# Patient Record
Sex: Female | Born: 1995 | Race: Black or African American | Hispanic: No | Marital: Single | Smoking: Never smoker
Health system: Southern US, Community
[De-identification: ages and names within clinical notes are randomized; demographics above are authoritative.]

## PROBLEM LIST (undated history)

## (undated) ENCOUNTER — Emergency Department (HOSPITAL_COMMUNITY): Admission: EM | Payer: Self-pay | Source: Home / Self Care

## (undated) HISTORY — PX: NO PAST SURGERIES: SHX2092

---
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
## 2016-07-30 ENCOUNTER — Emergency Department
Admission: EM | Admit: 2016-07-30 | Discharge: 2016-07-30 | Disposition: A | Discharge status: Home / Self Care | Payer: Medicaid Other | Attending: Emergency Medicine | Admitting: Emergency Medicine

## 2016-07-30 DIAGNOSIS — J020 Streptococcal pharyngitis: Secondary | ICD-10-CM | POA: Insufficient documentation

## 2016-07-30 DIAGNOSIS — J029 Acute pharyngitis, unspecified: Secondary | ICD-10-CM | POA: Diagnosis present

## 2016-07-30 LAB — POCT RAPID STREP A: STREPTOCOCCUS, GROUP A SCREEN (DIRECT): POSITIVE — AB

## 2016-07-30 MED ORDER — AMOXICILLIN 500 MG PO TABS: 500.0000 mg | ORAL_TABLET | Freq: Two times a day (BID) | ORAL | 0 refills | Status: DC

## 2016-07-30 MED ORDER — LIDOCAINE VISCOUS 2 % MT SOLN: 10.0000 mL | OROMUCOSAL | 0 refills | Status: DC | PRN

## 2016-07-30 NOTE — ED Triage Notes (Signed)
Pt reports to ED w/ c/o sore throat that start yesterday. Pt a/ox4, resp even and unlabored, pt ambulatory to triage w/o issue. +PO intake

## 2016-07-30 NOTE — ED Provider Notes (Signed)
[facility] Emergency Department Provider Note  ____________________________________________  Time seen: Approximately 4:05 PM  I have reviewed the triage vital signs and the nursing notes.   HISTORY  Chief Complaint Sore Throat    HPI Monica Strickland is a 21 y.o. female that presents to the emergency department with 2 days of sore throat. She states that she feels warm but has not checked her temperature. Patient is eating and drinking normally.She has had strep throat once previously. No alleviating measures been tried. She denies sick contacts. She denies congestion, cough, shortness of breath, chest pain, nausea, vomiting, abdominal pain.   History reviewed. No pertinent past medical history.  There are no active problems to display for this patient.   Past Surgical History:  Procedure Laterality Date  . NO PAST SURGERIES      Prior to Admission medications   Medication Sig Start Date End Date Taking? Authorizing Provider  amoxicillin (AMOXIL) 500 MG tablet Take 1 tablet (500 mg total) by mouth 2 (two) times daily. 07/30/16   Enid Derry, PA-C  lidocaine (XYLOCAINE) 2 % solution Use as directed 10 mLs in the mouth or throat as needed for mouth pain. 07/30/16   Enid Derry, PA-C    Allergies Patient has no known allergies.  No family history on file.  Social History Social History  Substance Use Topics  . Smoking status: Never Smoker  . Smokeless tobacco: Never Used  . Alcohol use No     Review of Systems  Constitutional: No chills Eyes: No visual changes. No discharge. ENT: Negative for congestion and rhinorrhea. Cardiovascular: No chest pain. Respiratory: Negative for cough. No SOB. Gastrointestinal: No abdominal pain.  No nausea, no vomiting.  No diarrhea.  No constipation. Musculoskeletal: Negative for musculoskeletal pain. Skin: Negative for rash, abrasions, lacerations, ecchymosis. Neurological: Negative for  headaches.   ____________________________________________   PHYSICAL EXAM:  VITAL SIGNS: ED Triage Vitals [07/30/16 1524]  Enc Vitals Group     BP 140/83     Pulse Rate 99     Resp 18     Temp 99.1 F (37.3 C)     Temp Source Oral     SpO2 99 %     Weight 180 lb (81.6 kg)     Height  (1.778 m)     Head Circumference      Peak Flow      Pain Score 10     Pain Loc      Pain Edu?      Excl. in GC?      Constitutional: Alert and oriented. Well appearing and in no acute distress. Eyes: Conjunctivae are normal. PERRL. EOMI. No discharge. Head: Atraumatic. ENT: No frontal and maxillary sinus tenderness.      Ears: Tympanic membranes pearly gray with good landmarks. No discharge.      Nose: Mild congestion/rhinnorhea.      Mouth/Throat: Mucous membranes are moist. Oropharynx erythematous. Tonsils enlarged bilaterally. Exudates present. Uvula midline. Neck: No stridor.   Hematological/Lymphatic/Immunilogical: Tender anterior cervical lymphadenopathy. Cardiovascular: Normal rate, regular rhythm.  Good peripheral circulation. Respiratory: Normal respiratory effort without tachypnea or retractions. Lungs CTAB. Good air entry to the bases with no decreased or absent breath sounds. Gastrointestinal: Bowel sounds 4 quadrants. Soft and nontender to palpation. No guarding or rigidity. No palpable masses. No distention. Musculoskeletal: Full range of motion to all extremities. No gross deformities appreciated. Neurologic:  Normal speech and language. No gross focal neurologic deficits are appreciated.  Skin:  Skin is warm, dry and intact. No rash noted.   ____________________________________________   LABS (all labs ordered are listed, but only abnormal results are displayed)  Labs Reviewed  POCT RAPID STREP A - Abnormal; Notable for the following:       Result Value   Streptococcus, Group A Screen (Direct) POSITIVE (*)    All other components within normal limits    ____________________________________________  EKG   ____________________________________________  RADIOLOGY   No results found.  ____________________________________________    PROCEDURES  Procedure(s) performed:    Procedures    Medications - No data to display   ____________________________________________   INITIAL IMPRESSION / ASSESSMENT AND PLAN / ED COURSE  Pertinent labs & imaging results that were available during my care of the patient were reviewed by me and considered in my medical decision making (see chart for details).  Review of the [place] CSRS was performed in accordance of the [facility] prior to dispensing any controlled drugs.     Patient's diagnosis is consistent with strep pharyngitis. Vital signs and exam are reassuring. Patient appears well and is staying well hydrated. Patient should alternate tylenol and ibuprofen for fever. Patient feels comfortable going home. Patient will be discharged home with prescriptions for amoxicillin and viscous lidocain. Patient is to follow up with PCP as needed or otherwise directed. Patient is given ED precautions to return to the ED for any worsening or new symptoms.     ____________________________________________  FINAL CLINICAL IMPRESSION(S) / ED DIAGNOSES  Final diagnoses:  Strep throat      NEW MEDICATIONS STARTED DURING THIS VISIT:  New Prescriptions   AMOXICILLIN (AMOXIL) 500 MG TABLET    Take 1 tablet (500 mg total) by mouth 2 (two) times daily.   LIDOCAINE (XYLOCAINE) 2 % SOLUTION    Use as directed 10 mLs in the mouth or throat as needed for mouth pain.        This chart was dictated using voice recognition software/Dragon. Despite best efforts to proofread, errors can occur which can change the meaning. Any change was purely unintentional.    Enid Derry, PA-C 07/30/16 1629    Myrna Blazer, MD 07/30/16 [phone]

## 2016-07-30 NOTE — ED Notes (Signed)
See triage note  States she developed sore throat about 3-4 days   No diff swallowing  Low grade fever on arrival

## 2017-09-11 ENCOUNTER — Emergency Department
Admission: EM | Admit: 2017-09-11 | Discharge: 2017-09-11 | Disposition: A | Discharge status: Home / Self Care | Payer: No Typology Code available for payment source | Attending: Emergency Medicine | Admitting: Emergency Medicine

## 2017-09-11 ENCOUNTER — Emergency Department: Payer: No Typology Code available for payment source

## 2017-09-11 ENCOUNTER — Other Ambulatory Visit: Payer: Self-pay

## 2017-09-11 DIAGNOSIS — S8011XA Contusion of right lower leg, initial encounter: Secondary | ICD-10-CM | POA: Diagnosis not present

## 2017-09-11 DIAGNOSIS — S80922A Unspecified superficial injury of left lower leg, initial encounter: Secondary | ICD-10-CM | POA: Insufficient documentation

## 2017-09-11 DIAGNOSIS — Y9389 Activity, other specified: Secondary | ICD-10-CM | POA: Diagnosis not present

## 2017-09-11 DIAGNOSIS — Y9289 Other specified places as the place of occurrence of the external cause: Secondary | ICD-10-CM | POA: Diagnosis not present

## 2017-09-11 DIAGNOSIS — Y999 Unspecified external cause status: Secondary | ICD-10-CM | POA: Insufficient documentation

## 2017-09-11 DIAGNOSIS — T07XXXA Unspecified multiple injuries, initial encounter: Secondary | ICD-10-CM

## 2017-09-11 DIAGNOSIS — S8991XA Unspecified injury of right lower leg, initial encounter: Secondary | ICD-10-CM | POA: Diagnosis present

## 2017-09-11 DIAGNOSIS — R2241 Localized swelling, mass and lump, right lower limb: Secondary | ICD-10-CM | POA: Diagnosis not present

## 2017-09-11 DIAGNOSIS — S80811A Abrasion, right lower leg, initial encounter: Secondary | ICD-10-CM | POA: Diagnosis not present

## 2017-09-11 MED ORDER — BACITRACIN-NEOMYCIN-POLYMYXIN 400-5-5000 EX OINT
TOPICAL_OINTMENT | Freq: Once | CUTANEOUS | Status: AC
  Administered 2017-09-11: 2 via TOPICAL
  Filled 2017-09-11: qty 1

## 2017-09-11 MED ORDER — HYDROMORPHONE HCL 1 MG/ML IJ SOLN
1.0000 mg | Freq: Once | INTRAMUSCULAR | Status: AC
  Administered 2017-09-11: 1 mg via INTRAMUSCULAR
  Filled 2017-09-11: qty 1

## 2017-09-11 MED ORDER — KETOROLAC TROMETHAMINE 60 MG/2ML IM SOLN
60.0000 mg | Freq: Once | INTRAMUSCULAR | Status: AC
  Administered 2017-09-11: 60 mg via INTRAMUSCULAR
  Filled 2017-09-11: qty 2

## 2017-09-11 MED ORDER — NEOMYCIN-POLYMYXIN-PRAMOXINE 1 % EX CREA: TOPICAL_CREAM | Freq: Two times a day (BID) | CUTANEOUS | 0 refills | Status: DC

## 2017-09-11 MED ORDER — TRAMADOL HCL 50 MG PO TABS: 50.0000 mg | ORAL_TABLET | Freq: Two times a day (BID) | ORAL | 0 refills | Status: DC | PRN

## 2017-09-11 MED ORDER — BACITRACIN-NEOMYCIN-POLYMYXIN 400-5-5000 EX OINT
TOPICAL_OINTMENT | CUTANEOUS | Status: AC
  Filled 2017-09-11: qty 1

## 2017-09-11 MED ORDER — IBUPROFEN 800 MG PO TABS: 800.0000 mg | ORAL_TABLET | Freq: Three times a day (TID) | ORAL | 0 refills | Status: DC | PRN

## 2017-09-11 NOTE — Discharge Instructions (Signed)
Daily dressing changes to abrasion and ambulate with crutches for 2 to 3 days as needed.
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
## 2018-11-14 ENCOUNTER — Emergency Department
Admission: EM | Admit: 2018-11-14 | Discharge: 2018-11-15 | Disposition: A | Discharge status: Home / Self Care | Payer: BC Managed Care – PPO | Attending: Emergency Medicine | Admitting: Emergency Medicine

## 2018-11-14 ENCOUNTER — Encounter: Payer: Self-pay | Admitting: Emergency Medicine

## 2018-11-14 DIAGNOSIS — Z79899 Other long term (current) drug therapy: Secondary | ICD-10-CM | POA: Diagnosis not present

## 2018-11-14 DIAGNOSIS — R112 Nausea with vomiting, unspecified: Secondary | ICD-10-CM | POA: Diagnosis not present

## 2018-11-14 DIAGNOSIS — R1010 Upper abdominal pain, unspecified: Secondary | ICD-10-CM | POA: Insufficient documentation

## 2018-11-14 LAB — POCT PREGNANCY, URINE: Preg Test, Ur: NEGATIVE

## 2018-11-14 LAB — URINALYSIS, COMPLETE (UACMP) WITH MICROSCOPIC
Bacteria, UA: NONE SEEN
Bilirubin Urine: NEGATIVE
Glucose, UA: NEGATIVE mg/dL
Ketones, ur: NEGATIVE mg/dL
Leukocytes,Ua: NEGATIVE
Nitrite: NEGATIVE
Protein, ur: NEGATIVE mg/dL
Specific Gravity, Urine: 1.013 (ref 1.005–1.030)
pH: 8 (ref 5.0–8.0)

## 2018-11-14 LAB — CBC
HCT: 36.2 % (ref 36.0–46.0)
Hemoglobin: 12.5 g/dL (ref 12.0–15.0)
MCH: 25.9 pg — ABNORMAL LOW (ref 26.0–34.0)
MCHC: 34.5 g/dL (ref 30.0–36.0)
MCV: 74.9 fL — ABNORMAL LOW (ref 80.0–100.0)
Platelets: 236 10*3/uL (ref 150–400)
RBC: 4.83 MIL/uL (ref 3.87–5.11)
RDW: 14.4 % (ref 11.5–15.5)
WBC: 7.4 10*3/uL (ref 4.0–10.5)
nRBC: 0 % (ref 0.0–0.2)

## 2018-11-14 LAB — COMPREHENSIVE METABOLIC PANEL
ALT: 14 U/L (ref 0–44)
AST: 19 U/L (ref 15–41)
Albumin: 4.2 g/dL (ref 3.5–5.0)
Alkaline Phosphatase: 41 U/L (ref 38–126)
Anion gap: 8 (ref 5–15)
BUN: 9 mg/dL (ref 6–20)
CO2: 24 mmol/L (ref 22–32)
Calcium: 9.4 mg/dL (ref 8.9–10.3)
Chloride: 105 mmol/L (ref 98–111)
Creatinine, Ser: 0.6 mg/dL (ref 0.44–1.00)
GFR calc Af Amer: >60 mL/min (ref >60)
GFR calc non Af Amer: >60 mL/min (ref >60)
Glucose, Bld: 100 mg/dL — ABNORMAL HIGH (ref 70–99)
Potassium: 3.9 mmol/L (ref 3.5–5.1)
Sodium: 137 mmol/L (ref 135–145)
Total Bilirubin: 0.6 mg/dL (ref 0.3–1.2)
Total Protein: 7.3 g/dL (ref 6.5–8.1)

## 2018-11-14 LAB — LIPASE, BLOOD: Lipase: 23 U/L (ref 11–51)

## 2018-11-14 NOTE — ED Triage Notes (Signed)
Pt c/o generalized abdominal pain, N/V/D x4 days. Pt denies urinary symptoms as well as fever.

## 2018-11-15 ENCOUNTER — Emergency Department: Payer: BC Managed Care – PPO

## 2018-11-15 MED ORDER — SUCRALFATE 1 G PO TABS: 1.0000 g | ORAL_TABLET | Freq: Four times a day (QID) | ORAL | 1 refills | Status: DC | PRN

## 2018-11-15 MED ORDER — HYDROCODONE-ACETAMINOPHEN 5-325 MG PO TABS: 2.0000 | ORAL_TABLET | Freq: Four times a day (QID) | ORAL | 0 refills | Status: DC | PRN

## 2018-11-15 MED ORDER — ONDANSETRON 4 MG PO TBDP: ORAL_TABLET | ORAL | 0 refills | Status: DC

## 2018-11-15 MED ORDER — OMEPRAZOLE MAGNESIUM 20 MG PO TBEC: 20.0000 mg | DELAYED_RELEASE_TABLET | Freq: Every day | ORAL | 1 refills | Status: DC

## 2018-11-15 NOTE — ED Notes (Signed)
Patient states having stomach pain since Saturday worse after eating and when going to bathroom. Mid upper belly pain tender with palpation. Patient states yesterday bowel movements was hard and pellet like and today watery diarrhea.

## 2018-11-15 NOTE — ED Provider Notes (Signed)
[facility] Emergency Department Provider Note  ____________________________________________   First MD Initiated Contact with Patient 11/15/18 0007     (approximate)  I have reviewed the triage vital signs and the nursing notes.   HISTORY  Chief Complaint Abdominal Pain    HPI Monica Strickland is a 23 y.o. female he denies any chronic medical issues and denies any prior surgeries.  She presents for evaluation of multiple episodes of pain in her upper abdomen for the last few days.  She said it is generally worse after she eats and sometimes when she goes to the bathroom.  She said that it would come and go, but today it has been constant.  Nothing in particular makes it better or worse.  It started after lunch today and has not gone away.  She has had some nausea and the other day she had a little bit of vomiting but the nausea and vomiting are not consistent.  She has not had these symptoms in the past.  She denies fever, sore throat, chest pain, shortness of breath, dysuria, pelvic pain, and vaginal bleeding.  She believes it is about time for her menstrual cycle to begin.  She denies having any concerns for STD and has no new sexual partners.  She reports that the pain in her abdomen can be severe at times although the intensity waxes and wanes.  She has not had anything to eat or drink for about 10 hours.         History reviewed. No pertinent past medical history.  There are no active problems to display for this patient.   Past Surgical History:  Procedure Laterality Date   NO PAST SURGERIES      Prior to Admission medications   Medication Sig Start Date End Date Taking? Authorizing Provider  amoxicillin (AMOXIL) 500 MG tablet Take 1 tablet (500 mg total) by mouth 2 (two) times daily. 07/30/16   Enid DerryWagner, Ashley, PA-C  HYDROcodone-acetaminophen (NORCO/VICODIN) 5-325 MG tablet Take 2 tablets by mouth every 6 (six) hours as needed for moderate pain or  severe pain. 11/15/18   Loleta RoseForbach, Roshan Salamon, MD  ibuprofen (ADVIL,MOTRIN) 800 MG tablet Take 1 tablet (800 mg total) by mouth every 8 (eight) hours as needed for moderate pain. 09/11/17   Joni ReiningSmith, Ronald K, PA-C  lidocaine (XYLOCAINE) 2 % solution Use as directed 10 mLs in the mouth or throat as needed for mouth pain. 07/30/16   Enid DerryWagner, Ashley, PA-C  neomycin-polymyxin-pramoxine (NEOSPORIN PLUS) 1 % cream Apply topically 2 (two) times daily. 09/11/17   Joni ReiningSmith, Ronald K, PA-C  omeprazole (PRILOSEC OTC) 20 MG tablet Take 1 tablet (20 mg total) by mouth daily. 11/15/18 11/15/19  Loleta RoseForbach, Brittiney Dicostanzo, MD  ondansetron (ZOFRAN ODT) 4 MG disintegrating tablet Allow 1-2 tablets to dissolve in your mouth every 8 hours as needed for nausea/vomiting 11/15/18   Loleta RoseForbach, Shlonda Dolloff, MD  sucralfate (CARAFATE) 1 g tablet Take 1 tablet (1 g total) by mouth 4 (four) times daily as needed (for abdominal discomfort, nausea, and/or vomiting). 11/15/18   Loleta RoseForbach, Janice Seales, MD  traMADol (ULTRAM) 50 MG tablet Take 1 tablet (50 mg total) by mouth every 12 (twelve) hours as needed. 09/11/17   Joni ReiningSmith, Ronald K, PA-C    Allergies Patient has no known allergies.  History reviewed. No pertinent family history.  Social History Social History   Tobacco Use   Smoking status: Never Smoker   Smokeless tobacco: Never Used  Substance Use Topics   Alcohol use: No  Alcohol/week: 0.0 standard drinks   Drug use: No    Review of Systems Constitutional: No fever/chills Eyes: No visual changes. ENT: No sore throat. Cardiovascular: Denies chest pain. Respiratory: Denies shortness of breath. Gastrointestinal: Upper abdominal pain with intermittent nausea vomiting as described above Genitourinary: Negative for dysuria. Musculoskeletal: Negative for neck pain.  Negative for back pain. Integumentary: Negative for rash. Neurological: Negative for headaches, focal weakness or numbness.   ____________________________________________   PHYSICAL  EXAM:  VITAL SIGNS: ED Triage Vitals  Enc Vitals Group     BP 11/14/18 2054 (!) 161/77     Pulse Rate 11/14/18 2054 76     Resp 11/14/18 2054 16     Temp 11/14/18 2054 98.4 F (36.9 C)     Temp Source 11/14/18 2054 Oral     SpO2 11/14/18 2054 98 %     Weight 11/14/18 2055 95.3 kg (210 lb)     Height 11/14/18 2055 1.778 m (5\' 10" )     Head Circumference --      Peak Flow --      Pain Score --      Pain Loc --      Pain Edu? --      Excl. in GC? --     Constitutional: Alert and oriented. Well appearing and in no acute distress. Eyes: Conjunctivae are normal.  Head: Atraumatic. Nose: No congestion/rhinnorhea. Mouth/Throat: Mucous membranes are moist. Neck: No stridor.  No meningeal signs.   Cardiovascular: Normal rate, regular rhythm. Good peripheral circulation. Grossly normal heart sounds. Respiratory: Normal respiratory effort.  No retractions. No audible wheezing. Gastrointestinal: Soft and nondistended.  No lower abdominal tenderness to palpation with no rebound or guarding and no tenderness at McBurney's point.  Moderate tenderness to palpation of the epigastrium and severe tenderness of the right upper quadrant with positive Murphy sign. Musculoskeletal: No lower extremity tenderness nor edema. No gross deformities of extremities. Neurologic:  Normal speech and language. No gross focal neurologic deficits are appreciated.  Skin:  Skin is warm, dry and intact. No rash noted. Psychiatric: Mood and affect are normal. Speech and behavior are normal.  ____________________________________________   LABS (all labs ordered are listed, but only abnormal results are displayed)  Labs Reviewed  COMPREHENSIVE METABOLIC PANEL - Abnormal; Notable for the following components:      Result Value   Glucose, Bld 100 (*)    All other components within normal limits  CBC - Abnormal; Notable for the following components:   MCV 74.9 (*)    MCH 25.9 (*)    All other components within  normal limits  URINALYSIS, COMPLETE (UACMP) WITH MICROSCOPIC - Abnormal; Notable for the following components:   Color, Urine YELLOW (*)    APPearance CLEAR (*)    Hgb urine dipstick SMALL (*)    All other components within normal limits  LIPASE, BLOOD  POC URINE PREG, ED  POCT PREGNANCY, URINE   ____________________________________________  EKG  None - EKG not ordered by ED physician ____________________________________________  RADIOLOGY   ED MD interpretation:  Partially distended gallbladder with borderline wall thickening but no biliary dilatation and no obvious stones.  Official radiology report(s): [place] Abdomen Limited Ruq  Result Date: 11/15/2018 CLINICAL DATA:  Epigastric and right upper quadrant pain.  Nausea. EXAM: ULTRASOUND ABDOMEN LIMITED RIGHT UPPER QUADRANT COMPARISON:  None. FINDINGS: Gallbladder: Only partially distended. No gallstones. Wall thickness of 3.5 mm likely related to degree of distension. No pericholecystic fluid. No sonographic Murphy sign noted by  sonographer. Common bile duct: Diameter: 3 mm. Liver: No focal lesion identified. Within normal limits in parenchymal echogenicity. Portal vein is patent on color Doppler imaging with normal direction of blood flow towards the liver. IMPRESSION: No gallstones. Partially distended gallbladder with borderline wall thickening, likely related to incomplete distension. No biliary dilatation. Electronically Signed   By: Keith Rake M.D.   On: 11/15/2018 02:02    ____________________________________________   PROCEDURES   Procedure(s) performed (including Critical Care):  Procedures   ____________________________________________   INITIAL IMPRESSION / MDM / [place] / ED COURSE  As part of my medical decision making, I reviewed the following data within the [place] notes reviewed and incorporated, Labs reviewed , Old chart reviewed, Notes from prior ED visits and  [place] Controlled Substance Database   Differential diagnosis includes, but is not limited to, biliary colic including possible cholecystitis, acid reflux, peptic ulcer or gastric ulcer disease, less likely appendicitis or pneumonia or STD/PID.  The patient is well-appearing in no distress although she is severely tender to palpation in the upper abdomen and specifically the right upper quadrant.  Vital signs are normal and stable and afebrile.  No known contact with COVID-19 patients.  She has been n.p.o. long enough that we can get an ultrasound to evaluate the gallbladder.  Of note, her urinalysis is within normal limits, lipase normal, comprehensive metabolic panel is normal with no LFT elevation, and her CBC is normal.      Clinical Course as of Nov 15 235  Wed Nov 15, 2018  0231 Slightly distended gallbladder with a borderline thickened gallbladder wall but no stones visualized.  Common bile duct is normal as per radiology interpretation.   [CF]  2355 I discussed with the patient who is currently in no distress and ambulatory without any discomfort or apparent pain.  We talked about obtaining a CT scan but I explained to her that I do not think that this would be clinically useful at this time and she agrees that she does not want or need it.  We talked about the possibility that she had a gallstone that she passed which I think is the most likely diagnosis.  We will also treat for the possibility of acid reflux.  I told her about the clinical presentation of choledocholithiasis and encouraged her to come back if she develops any new or worsening symptoms and she understands and agrees.  I am providing follow-up information with gastroenterology and encourage her to call to schedule an appointment.  She also asked about women's health maintenance and I am providing the name and number of a OB/GYN with whom she can schedule an appointment.  She agrees with the plan.   [CF]    Clinical Course User  Index [CF] Hinda Kehr, MD     ____________________________________________  FINAL CLINICAL IMPRESSION(S) / ED DIAGNOSES  Final diagnoses:  Upper abdominal pain     MEDICATIONS GIVEN DURING THIS VISIT:  Medications - No data to display   ED Discharge Orders         Ordered    HYDROcodone-acetaminophen (NORCO/VICODIN) 5-325 MG tablet  Every 6 hours PRN     11/15/18 0234    ondansetron (ZOFRAN ODT) 4 MG disintegrating tablet     11/15/18 0234    sucralfate (CARAFATE) 1 g tablet  4 times daily PRN     11/15/18 0234    omeprazole (PRILOSEC OTC) 20 MG tablet  Daily  11/15/18 0234          *Please note:  Monica Strickland was evaluated in Emergency Department on 11/15/2018 for the symptoms described in the history of present illness. She was evaluated in the context of the global COVID-19 pandemic, which necessitated consideration that the patient might be at risk for infection with the SARS-CoV-2 virus that causes COVID-19. Institutional protocols and algorithms that pertain to the evaluation of patients at risk for COVID-19 are in a state of rapid change based on information released by regulatory bodies including the CDC and federal and state organizations. These policies and algorithms were followed during the patient's care in the ED.  Some ED evaluations and interventions may be delayed as a result of limited staffing during the pandemic.*  Note:  This document was prepared using Dragon voice recognition software and may include unintentional dictation errors.   Loleta RoseForbach, Elbia Paro, MD 11/15/18 [phone]

## 2018-11-15 NOTE — Discharge Instructions (Signed)
As we discussed, your work-up was reassuring today.  Your symptoms strongly suggest gallstones, but your ultrasound is generally reassuring.  It is possible that you had a small gallstone that already passed.  I recommend you read through the included information and use the prescribed medication as instructed.  Please follow-up with gastroenterology for a follow-up appointment at the next available opportunity.  As per our discussion, I also included information about follow-up with OB/GYN if you are interested in establishing a woman's health provider.    Return to the emergency department if you develop new or worsening symptoms that concern you.

## 2019-01-05 ENCOUNTER — Encounter (HOSPITAL_COMMUNITY): Payer: Self-pay

## 2019-01-05 ENCOUNTER — Ambulatory Visit (HOSPITAL_COMMUNITY)
Admission: EM | Admit: 2019-01-05 | Discharge: 2019-01-05 | Disposition: A | Discharge status: Home / Self Care | Payer: Self-pay | Attending: Family Medicine | Admitting: Family Medicine

## 2019-01-05 ENCOUNTER — Emergency Department (HOSPITAL_COMMUNITY)
Admission: EM | Admit: 2019-01-05 | Discharge: 2019-01-05 | Discharge status: Absent without leave | Payer: BC Managed Care – PPO

## 2019-01-05 DIAGNOSIS — L0291 Cutaneous abscess, unspecified: Secondary | ICD-10-CM

## 2019-01-05 MED ORDER — CEPHALEXIN 500 MG PO CAPS: 500.0000 mg | ORAL_CAPSULE | Freq: Four times a day (QID) | ORAL | 0 refills | Status: DC

## 2019-01-05 NOTE — ED Provider Notes (Addendum)
[facility]    CSN: [id number] Arrival date & time: 01/05/19  [id number]      History   Chief Complaint Chief Complaint  Patient presents with  . Abscess    HPI Monica Strickland is a 23 y.o. female.   Pt is a 23 year old female that presents today with abscess to left axilla.  Symptoms have been constant and worsening.  Symptoms have been present for approximately 1 week.  Denies any history of the same.  Reporting that she has had a lump to the axilla for a long time but never cause any issues.  Denies any fever, drainage.  She has been doing warm compresses on the area. No hx of MRSA  ROS per HPI      History reviewed. No pertinent past medical history.  There are no active problems to display for this patient.   Past Surgical History:  Procedure Laterality Date  . NO PAST SURGERIES      OB History   No obstetric history on file.      Home Medications    Prior to Admission medications   Medication Sig Start Date End Date Taking? Authorizing Provider  cephALEXin (KEFLEX) 500 MG capsule Take 1 capsule (500 mg total) by mouth 4 (four) times daily. 01/05/19   Dahlia ByesBast, Quanesha Klimaszewski A, NP  omeprazole (PRILOSEC OTC) 20 MG tablet Take 1 tablet (20 mg total) by mouth daily. 11/15/18 11/15/19  Loleta RoseForbach, Cory, MD  ondansetron (ZOFRAN ODT) 4 MG disintegrating tablet Allow 1-2 tablets to dissolve in your mouth every 8 hours as needed for nausea/vomiting 11/15/18   Loleta RoseForbach, Cory, MD  sucralfate (CARAFATE) 1 g tablet Take 1 tablet (1 g total) by mouth 4 (four) times daily as needed (for abdominal discomfort, nausea, and/or vomiting). 11/15/18   Loleta RoseForbach, Cory, MD    Family History Family History  Problem Relation Age of Onset  . Healthy Mother   . Healthy Father     Social History Social History   Tobacco Use  . Smoking status: Never Smoker  . Smokeless tobacco: Never Used  Substance Use Topics  . Alcohol use: No    Alcohol/week: 0.0 standard drinks  . Drug use: No      Allergies   Patient has no known allergies.   Review of Systems Review of Systems   Physical Exam Triage Vital Signs ED Triage Vitals  Enc Vitals Group     BP 01/05/19 0847 106/68     Pulse Rate 01/05/19 0847 90     Resp --      Temp 01/05/19 0847 97.6 F (36.4 C)     Temp Source 01/05/19 0847 Oral     SpO2 01/05/19 0847 97 %     Weight --      Height --      Head Circumference --      Peak Flow --      Pain Score 01/05/19 0845 10     Pain Loc --      Pain Edu? --      Excl. in GC? --    No data found.  Updated Vital Signs BP 106/68 (BP Location: Right Arm)   Pulse 90   Temp 97.6 F (36.4 C) (Oral)   SpO2 97%   Visual Acuity Right Eye Distance:   Left Eye Distance:   Bilateral Distance:    Right Eye Near:   Left Eye Near:    Bilateral Near:     Physical Exam  Vitals signs and nursing note reviewed.  Constitutional:      General: She is not in acute distress.    Appearance: Normal appearance. She is not ill-appearing, toxic-appearing or diaphoretic.  HENT:     Head: Normocephalic.     Nose: Nose normal.     Mouth/Throat:     Pharynx: Oropharynx is clear.  Eyes:     Conjunctiva/sclera: Conjunctivae normal.  Neck:     Musculoskeletal: Normal range of motion.  Pulmonary:     Effort: Pulmonary effort is normal.  Musculoskeletal: Normal range of motion.  Skin:    General: Skin is warm and dry.     Findings: No rash.     Comments: Approximated 3 to 4 cm abscess to left axilla, 2 cm fluctuance and surrounding erythema and mild induration. TTP.   Neurological:     Mental Status: She is alert.  Psychiatric:        Mood and Affect: Mood normal.      UC Treatments / Results  Labs (all labs ordered are listed, but only abnormal results are displayed) Labs Reviewed - No data to display  EKG   Radiology No results found.  Procedures Incision and Drainage  Date/Time: 01/05/2019 9:33 AM Performed by: Janace Aris, NP Authorized by: Janace Aris, NP   Consent:    Consent obtained:  Verbal   Consent given by:  Patient   Risks discussed:  Bleeding, incomplete drainage and pain   Alternatives discussed:  No treatment Universal protocol:    Patient identity confirmed:  Verbally with patient Location:    Type:  Abscess   Size:  4   Location:  Upper extremity   Upper extremity location:  Arm   Arm location:  L upper arm Pre-procedure details:    Skin preparation:  Betadine Anesthesia (see MAR for exact dosages):    Anesthesia method:  Local infiltration   Local anesthetic:  Lidocaine 1% w/o epi Procedure type:    Complexity:  Simple Procedure details:    Needle aspiration: no     Incision types:  Single straight   Incision depth:  Subcutaneous   Scalpel blade:  11   Wound management:  Probed and deloculated   Drainage:  Purulent and bloody   Drainage amount:  Moderate   Wound treatment:  Wound left open   Packing materials:  None Post-procedure details:    Patient tolerance of procedure:  Tolerated well, no immediate complications   (including critical care time)  Medications Ordered in UC Medications - No data to display  Initial Impression / Assessment and Plan / UC Course  I have reviewed the triage vital signs and the nursing notes.  Pertinent labs & imaging results that were available during my care of the patient were reviewed by me and considered in my medical decision making (see chart for details).     Abscess I&D Pt tolerated well Keflex for abx coverage.  Follow up as needed for continued or worsening symptoms  Final Clinical Impressions(s) / UC Diagnoses   Final diagnoses:  Abscess     Discharge Instructions     We drained the abscess today Take the antibiotics as prescribed.  Follow up as needed for continued or worsening symptoms     ED Prescriptions    Medication Sig Dispense Auth. Provider   cephALEXin (KEFLEX) 500 MG capsule Take 1 capsule (500 mg total) by mouth 4 (four)  times daily. 28 capsule Dahlia Byes A, NP  Controlled Substance Prescriptions [place] Controlled Substance Registry consulted? Not Applicable   Orvan July, NP 01/05/19 0932    Orvan July, NP 01/05/19 [phone]

## 2019-01-05 NOTE — ED Triage Notes (Signed)
Pt report having a boil under her left arm for 1 month, she states painful and swelling.

## 2019-01-05 NOTE — Discharge Instructions (Signed)
We drained the abscess today Take the antibiotics as prescribed.  Follow up as needed for continued or worsening symptoms

## 2019-03-05 ENCOUNTER — Encounter (HOSPITAL_COMMUNITY): Payer: Self-pay | Admitting: Emergency Medicine

## 2019-03-05 ENCOUNTER — Emergency Department (HOSPITAL_COMMUNITY)
Admission: EM | Admit: 2019-03-05 | Discharge: 2019-03-05 | Disposition: A | Discharge status: Home / Self Care | Payer: Self-pay | Attending: Emergency Medicine | Admitting: Emergency Medicine

## 2019-03-05 ENCOUNTER — Other Ambulatory Visit: Payer: Self-pay

## 2019-03-05 DIAGNOSIS — Z79899 Other long term (current) drug therapy: Secondary | ICD-10-CM | POA: Insufficient documentation

## 2019-03-05 DIAGNOSIS — R21 Rash and other nonspecific skin eruption: Secondary | ICD-10-CM | POA: Insufficient documentation

## 2019-03-05 NOTE — ED Triage Notes (Signed)
Patient here from home with complaints of rash to chest and back. Also reports circular rash to back that itches x1 week. Denies allergies.

## 2019-03-05 NOTE — Discharge Instructions (Addendum)
You may apply some Benadryl cream to your rash, you may also take Claritin or Zyrtec over-the-counter to help with your symptoms.  A number to dermatology has been provided in your chart, please schedule an appointment for further management of your rash.   If you experience any fevers, blistering of your skin, diarrhea please return to the emergency room.

## 2019-03-05 NOTE — ED Provider Notes (Signed)
[facility] DEPT Provider Note   CSN: [id number] Arrival date & time: 03/05/19  1017     History   Chief Complaint Chief Complaint  Patient presents with  . Rash    HPI Monica Strickland is a 23 y.o. female.     23 y.o female with no PMH presents to the ED with a chief complaint of rash x 3 days. Patient reports she first noted this on her chest, has now transfer onto her abdomen and back.  She did have a prior ringworm to her back which she has been placing antifungal for.  She reports the rash is nonpruritic, has not been taking any medication for improvement in her symptoms.  She reports examined her bed thoroughly and not finding any insects.  She denies any nausea, vomiting, fevers, blistering to skin.  The history is provided by the patient.  Rash Associated symptoms: no fever     History reviewed. No pertinent past medical history.  There are no active problems to display for this patient.   Past Surgical History:  Procedure Laterality Date  . NO PAST SURGERIES       OB History   No obstetric history on file.      Home Medications    Prior to Admission medications   Medication Sig Start Date End Date Taking? Authorizing Provider  cephALEXin (KEFLEX) 500 MG capsule Take 1 capsule (500 mg total) by mouth 4 (four) times daily. 01/05/19   Loura Halt A, NP  omeprazole (PRILOSEC OTC) 20 MG tablet Take 1 tablet (20 mg total) by mouth daily. 11/15/18 11/15/19  Hinda Kehr, MD  ondansetron (ZOFRAN ODT) 4 MG disintegrating tablet Allow 1-2 tablets to dissolve in your mouth every 8 hours as needed for nausea/vomiting 11/15/18   Hinda Kehr, MD  sucralfate (CARAFATE) 1 g tablet Take 1 tablet (1 g total) by mouth 4 (four) times daily as needed (for abdominal discomfort, nausea, and/or vomiting). 11/15/18   Hinda Kehr, MD    Family History Family History  Problem Relation Age of Onset  . Healthy Mother   . Healthy Father     Social  History Social History   Tobacco Use  . Smoking status: Never Smoker  . Smokeless tobacco: Never Used  Substance Use Topics  . Alcohol use: No    Alcohol/week: 0.0 standard drinks  . Drug use: No     Allergies   Patient has no known allergies.   Review of Systems Review of Systems  Constitutional: Negative for fever.  Skin: Positive for rash.     Physical Exam Updated Vital Signs BP 130/81 (BP Location: Right Arm)   Pulse 63   Temp 98.3 F (36.8 C) (Oral)   Resp 16   SpO2 100%   Physical Exam Vitals signs and nursing note reviewed.  Constitutional:      General: She is not in acute distress.    Appearance: She is well-developed.  HENT:     Head: Normocephalic and atraumatic.     Mouth/Throat:     Pharynx: No oropharyngeal exudate.  Eyes:     Pupils: Pupils are equal, round, and reactive to light.  Neck:     Musculoskeletal: Normal range of motion.  Cardiovascular:     Rate and Rhythm: Regular rhythm.     Heart sounds: Normal heart sounds.  Pulmonary:     Effort: Pulmonary effort is normal. No respiratory distress.     Breath sounds: Normal breath sounds.  Abdominal:  General: Bowel sounds are normal. There is no distension.     Palpations: Abdomen is soft.     Tenderness: There is no abdominal tenderness.  Musculoskeletal:        General: No tenderness or deformity.     Right lower leg: No edema.     Left lower leg: No edema.  Skin:    General: Skin is warm and dry.     Findings: Rash present. No erythema, laceration, petechiae or wound. Rash is urticarial. Rash is not crusting, macular, papular, purpuric, pustular or vesicular.          Comments: Urticarial rash throughout her chest, abdomen, flanks.  Neurological:     Mental Status: She is alert and oriented to person, place, and time.      ED Treatments / Results  Labs (all labs ordered are listed, but only abnormal results are displayed) Labs Reviewed - No data to display  EKG None   Radiology No results found.  Procedures Procedures (including critical care time)  Medications Ordered in ED Medications - No data to display   Initial Impression / Assessment and Plan / ED Course  I have reviewed the triage vital signs and the nursing notes.  Pertinent labs & imaging results that were available during my care of the patient were reviewed by me and considered in my medical decision making (see chart for details).       Patient with no past medical history presents to the ED with complaints of rash, reports the symptoms began 3 days ago, this is nonpruritic in nature.  She has been applying antifungal cream to a ringworm on her back.  She denies any fevers, nausea, vomiting, diarrhea.   Rash seems to be scatter, without any pattern suspect that this is likely more insect bites versus rash.  Low suspicion for any scabies, risks, and her thighs are spared.  Some suspicion for bedbugs, although she reported she checked her bed several times and no apparent insects were visible.  Patient will go home with over-the-counter Benadryl along with Claritin or Zyrtec to help with her symptoms.  She would like further management of her ringworm, provided with a referral for dermatology.  There is no blistering of the skin, changes such as erythema, getting to suggest any cellulitis.  Patient otherwise with stable vital signs, afebrile.  Patient stable for discharge.  Portions of this note were generated with Scientist, clinical (histocompatibility and immunogenetics). Dictation errors may occur despite best attempts at proofreading.  Final Clinical Impressions(s) / ED Diagnoses   Final diagnoses:  Rash    ED Discharge Orders    None       Claude Manges, PA-C 03/05/19 1110    Arby Barrette, MD 03/09/19 1245

## 2019-04-20 ENCOUNTER — Emergency Department
Admission: EM | Admit: 2019-04-20 | Discharge: 2019-04-21 | Disposition: A | Discharge status: Home / Self Care | Payer: No Typology Code available for payment source | Attending: Emergency Medicine | Admitting: Emergency Medicine

## 2019-04-20 ENCOUNTER — Other Ambulatory Visit: Payer: Self-pay

## 2019-04-20 ENCOUNTER — Emergency Department: Payer: No Typology Code available for payment source

## 2019-04-20 ENCOUNTER — Encounter: Payer: Self-pay | Admitting: Emergency Medicine

## 2019-04-20 DIAGNOSIS — Y92410 Unspecified street and highway as the place of occurrence of the external cause: Secondary | ICD-10-CM | POA: Insufficient documentation

## 2019-04-20 DIAGNOSIS — M25562 Pain in left knee: Secondary | ICD-10-CM | POA: Insufficient documentation

## 2019-04-20 DIAGNOSIS — M549 Dorsalgia, unspecified: Secondary | ICD-10-CM | POA: Diagnosis not present

## 2019-04-20 DIAGNOSIS — Y9389 Activity, other specified: Secondary | ICD-10-CM | POA: Diagnosis not present

## 2019-04-20 DIAGNOSIS — Z79899 Other long term (current) drug therapy: Secondary | ICD-10-CM | POA: Insufficient documentation

## 2019-04-20 DIAGNOSIS — M542 Cervicalgia: Secondary | ICD-10-CM | POA: Insufficient documentation

## 2019-04-20 DIAGNOSIS — Y998 Other external cause status: Secondary | ICD-10-CM | POA: Diagnosis not present

## 2019-04-20 MED ORDER — METHOCARBAMOL 500 MG PO TABS: 500.0000 mg | ORAL_TABLET | Freq: Three times a day (TID) | ORAL | 0 refills | Status: AC | PRN

## 2019-04-20 MED ORDER — MELOXICAM 15 MG PO TABS: 15.0000 mg | ORAL_TABLET | Freq: Every day | ORAL | 1 refills | Status: AC
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
## 2019-06-11 LAB — HM PAP SMEAR: HM Pap smear: NORMAL

## 2019-06-18 ENCOUNTER — Ambulatory Visit: Payer: Self-pay | Attending: Internal Medicine

## 2019-06-18 DIAGNOSIS — Z20822 Contact with and (suspected) exposure to covid-19: Secondary | ICD-10-CM

## 2019-06-19 ENCOUNTER — Other Ambulatory Visit: Payer: Self-pay

## 2019-06-19 ENCOUNTER — Encounter: Payer: Self-pay | Admitting: Emergency Medicine

## 2019-06-19 DIAGNOSIS — Z3202 Encounter for pregnancy test, result negative: Secondary | ICD-10-CM | POA: Insufficient documentation

## 2019-06-19 DIAGNOSIS — N739 Female pelvic inflammatory disease, unspecified: Secondary | ICD-10-CM | POA: Insufficient documentation

## 2019-06-19 LAB — LIPASE, BLOOD: Lipase: 21 U/L (ref 11–51)

## 2019-06-19 LAB — CBC WITH DIFFERENTIAL/PLATELET
Abs Immature Granulocytes: 0.05 10*3/uL (ref 0.00–0.07)
Basophils Absolute: 0.1 10*3/uL (ref 0.0–0.1)
Basophils Relative: 0 %
Eosinophils Absolute: 0 10*3/uL (ref 0.0–0.5)
Eosinophils Relative: 0 %
HCT: 35 % — ABNORMAL LOW (ref 36.0–46.0)
Hemoglobin: 12 g/dL (ref 12.0–15.0)
Immature Granulocytes: 0 %
Lymphocytes Relative: 16 %
Lymphs Abs: 1.9 10*3/uL (ref 0.7–4.0)
MCH: 24.9 pg — ABNORMAL LOW (ref 26.0–34.0)
MCHC: 34.3 g/dL (ref 30.0–36.0)
MCV: 72.6 fL — ABNORMAL LOW (ref 80.0–100.0)
Monocytes Absolute: 1.1 10*3/uL — ABNORMAL HIGH (ref 0.1–1.0)
Monocytes Relative: 9 %
Neutro Abs: 9 10*3/uL — ABNORMAL HIGH (ref 1.7–7.7)
Neutrophils Relative %: 75 %
Platelets: 301 10*3/uL (ref 150–400)
RBC: 4.82 MIL/uL (ref 3.87–5.11)
RDW: 15.1 % (ref 11.5–15.5)
WBC: 12.1 10*3/uL — ABNORMAL HIGH (ref 4.0–10.5)
nRBC: 0 % (ref 0.0–0.2)

## 2019-06-19 LAB — COMPREHENSIVE METABOLIC PANEL
ALT: 15 U/L (ref 0–44)
AST: 16 U/L (ref 15–41)
Albumin: 4 g/dL (ref 3.5–5.0)
Alkaline Phosphatase: 52 U/L (ref 38–126)
Anion gap: 10 (ref 5–15)
BUN: 9 mg/dL (ref 6–20)
CO2: 26 mmol/L (ref 22–32)
Calcium: 8.7 mg/dL — ABNORMAL LOW (ref 8.9–10.3)
Chloride: 101 mmol/L (ref 98–111)
Creatinine, Ser: 0.85 mg/dL (ref 0.44–1.00)
GFR calc Af Amer: >60 mL/min (ref >60)
GFR calc non Af Amer: >60 mL/min (ref >60)
Glucose, Bld: 108 mg/dL — ABNORMAL HIGH (ref 70–99)
Potassium: 3.7 mmol/L (ref 3.5–5.1)
Sodium: 137 mmol/L (ref 135–145)
Total Bilirubin: 0.8 mg/dL (ref 0.3–1.2)
Total Protein: 8 g/dL (ref 6.5–8.1)

## 2019-06-19 LAB — NOVEL CORONAVIRUS, NAA: SARS-CoV-2, NAA: NOT DETECTED

## 2019-06-19 LAB — POCT PREGNANCY, URINE: Preg Test, Ur: NEGATIVE

## 2019-06-19 NOTE — ED Triage Notes (Signed)
Patient ambulatory to triage with steady gait, without difficulty or distress noted, mask in place; pt reports having lower abd/pelvic pain since Saturday accomp by dysuria and white vag discharge; received inj for gonorrhea today; last BM Saturday

## 2019-06-20 ENCOUNTER — Emergency Department: Payer: Self-pay

## 2019-06-20 ENCOUNTER — Emergency Department
Admission: EM | Admit: 2019-06-20 | Discharge: 2019-06-20 | Disposition: A | Discharge status: Home / Self Care | Payer: Self-pay | Attending: Emergency Medicine | Admitting: Emergency Medicine

## 2019-06-20 DIAGNOSIS — N730 Acute parametritis and pelvic cellulitis: Secondary | ICD-10-CM

## 2019-06-20 DIAGNOSIS — R1030 Lower abdominal pain, unspecified: Secondary | ICD-10-CM

## 2019-06-20 LAB — HCG, QUANTITATIVE, PREGNANCY: hCG, Beta Chain, Quant, S: <1 m[IU]/mL (ref <5)

## 2019-06-20 LAB — URINALYSIS, COMPLETE (UACMP) WITH MICROSCOPIC
Bilirubin Urine: NEGATIVE
Glucose, UA: NEGATIVE mg/dL
Ketones, ur: NEGATIVE mg/dL
Leukocytes,Ua: NEGATIVE
Nitrite: NEGATIVE
Protein, ur: 100 mg/dL — AB
Specific Gravity, Urine: 1.026 (ref 1.005–1.030)
pH: 5 (ref 5.0–8.0)

## 2019-06-20 MED ORDER — DOXYCYCLINE HYCLATE 100 MG PO CAPS: 100.0000 mg | ORAL_CAPSULE | Freq: Two times a day (BID) | ORAL | 0 refills | Status: AC

## 2019-06-20 MED ORDER — ONDANSETRON 4 MG PO TBDP: 4.0000 mg | ORAL_TABLET | Freq: Three times a day (TID) | ORAL | 0 refills | Status: DC | PRN

## 2019-06-20 MED ORDER — DOXYCYCLINE HYCLATE 100 MG PO TABS
100.0000 mg | ORAL_TABLET | Freq: Once | ORAL | Status: AC
  Administered 2019-06-20: 03:00:00 100 mg via ORAL
  Filled 2019-06-20: qty 1

## 2019-06-20 MED ORDER — KETOROLAC TROMETHAMINE 30 MG/ML IJ SOLN
15.0000 mg | Freq: Once | INTRAMUSCULAR | Status: AC
  Administered 2019-06-20: 15 mg via INTRAVENOUS
  Filled 2019-06-20: qty 1

## 2019-06-20 MED ORDER — IBUPROFEN 800 MG PO TABS: 800.0000 mg | ORAL_TABLET | Freq: Three times a day (TID) | ORAL | 0 refills | Status: DC | PRN

## 2019-06-20 NOTE — ED Provider Notes (Signed)
[facility] Emergency Department Provider Note  ____________________________________________  Time seen: Approximately 2:29 AM  I have reviewed the triage vital signs and the nursing notes.   HISTORY  Chief Complaint Pelvic Pain and Abdominal Pain   HPI Monica Strickland is a 24 y.o. female with no significant past medical history who presents for evaluation of pelvic and abdominal pain.   Patient reports that her symptoms have been ongoing for 3 days.  She underwent a Pap smear and pelvic exam 9 days ago.  Her STD screening came back positive for gonorrhea.  She only received treatment for today with an IM shot of 500 mg of Rocephin.  She continues to have lower abdominal pain that she describes as sharp and throbbing, severe, constant and nonradiating.  Continues to have white vaginal discharge.  She is complaining of pressure with urination but no dysuria.  She has not had sex since the pain started so she is not sure if she has any pain in her vaginal canal.  No fever or chills, no vomiting.  PMH None - reviewed  Past Surgical History:  Procedure Laterality Date  . NO PAST SURGERIES      Prior to Admission medications   Medication Sig Start Date End Date Taking? Authorizing Provider  doxycycline (VIBRAMYCIN) 100 MG capsule Take 1 capsule (100 mg total) by mouth 2 (two) times daily for 10 days. 06/20/19 06/30/19  Rudene Re, MD  ibuprofen (ADVIL) 800 MG tablet Take 1 tablet (800 mg total) by mouth every 8 (eight) hours as needed. 06/20/19   Rudene Re, MD  ondansetron (ZOFRAN ODT) 4 MG disintegrating tablet Take 1 tablet (4 mg total) by mouth every 8 (eight) hours as needed. 06/20/19   Rudene Re, MD    Allergies Patient has no known allergies.  Family History  Problem Relation Age of Onset  . Healthy Mother   . Healthy Father     Social History Social History   Tobacco Use  . Smoking status: Never Smoker  . Smokeless  tobacco: Never Used  Substance Use Topics  . Alcohol use: No    Alcohol/week: 0.0 standard drinks  . Drug use: No    Review of Systems  Constitutional: Negative for fever. Eyes: Negative for visual changes. ENT: Negative for sore throat. Neck: No neck pain  Cardiovascular: Negative for chest pain. Respiratory: Negative for shortness of breath. Gastrointestinal: + lower abdominal pain. No vomiting or diarrhea. Genitourinary: Negative for dysuria. Musculoskeletal: Negative for back pain. Skin: Negative for rash. Neurological: Negative for headaches, weakness or numbness. Psych: No SI or HI  ____________________________________________   PHYSICAL EXAM:  VITAL SIGNS: ED Triage Vitals  Enc Vitals Group     BP 06/19/19 2313 132/82     Pulse Rate 06/19/19 2313 (!) 112     Resp --      Temp 06/19/19 2313 99.5 F (37.5 C)     Temp Source 06/19/19 2313 Oral     SpO2 06/19/19 2313 97 %     Weight 06/19/19 2314 190 lb (86.2 kg)     Height 06/19/19 2314 5\' 10"  (1.778 m)     Head Circumference --      Peak Flow --      Pain Score 06/19/19 2313 10     Pain Loc --      Pain Edu? --      Excl. in [place]? --     Constitutional: Alert and oriented. Well appearing and in no  apparent distress. HEENT:      Head: Normocephalic and atraumatic.         Eyes: Conjunctivae are normal. Sclera is non-icteric.       Mouth/Throat: Mucous membranes are moist.       Neck: Supple with no signs of meningismus. Cardiovascular: Regular rate and rhythm. No murmurs. Respiratory: Normal respiratory effort. Lungs are clear to auscultation bilaterally. No wheezes, crackles, or rhonchi.  Gastrointestinal: Soft, mild diffuse tenderness palpation of the lower quadrants, and non distended with positive bowel sounds. No rebound or guarding. Pelvic exam: Normal external genitalia, no rashes or lesions. White thick discharge. Os closed. + CMT and L adnexal tenderness.   Musculoskeletal: Nontender with normal  range of motion in all extremities. No edema, cyanosis, or erythema of extremities. Neurologic: Normal speech and language. Face is symmetric. Moving all extremities. No gross focal neurologic deficits are appreciated. Skin: Skin is warm, dry and intact. No rash noted. Psychiatric: Mood and affect are normal. Speech and behavior are normal.  ____________________________________________   LABS (all labs ordered are listed, but only abnormal results are displayed)  Labs Reviewed  CBC WITH DIFFERENTIAL/PLATELET - Abnormal; Notable for the following components:      Result Value   WBC 12.1 (*)    HCT 35.0 (*)    MCV 72.6 (*)    MCH 24.9 (*)    Neutro Abs 9.0 (*)    Monocytes Absolute 1.1 (*)    All other components within normal limits  COMPREHENSIVE METABOLIC PANEL - Abnormal; Notable for the following components:   Glucose, Bld 108 (*)    Calcium 8.7 (*)    All other components within normal limits  URINALYSIS, COMPLETE (UACMP) WITH MICROSCOPIC - Abnormal; Notable for the following components:   Color, Urine YELLOW (*)    APPearance HAZY (*)    Hgb urine dipstick SMALL (*)    Protein, ur 100 (*)    Bacteria, UA RARE (*)    All other components within normal limits  LIPASE, BLOOD  HCG, QUANTITATIVE, PREGNANCY  POCT PREGNANCY, URINE   ____________________________________________  EKG  none  ____________________________________________  RADIOLOGY  I have personally reviewed the images performed during this visit and I agree with the Radiologist's read.   Interpretation by Radiologist:  US PELVIC COMPLETE W TRANSVAGINAL AND TORSION R/O  Result Date: 06/20/2019 CLINICAL DATA:  Lower abdominal pain for 4 days, negative UPT, LMP 06/12/2019 EXAM: TRANSABDOMINAL AND TRANSVAGINAL ULTRASOUND OF PELVIS DOPPLER ULTRASOUND OF OVARIES TECHNIQUE: Both transabdominal and transvaginal ultrasound examinations of the pelvis were performed. Transabdominal technique was performed for global  imaging of the pelvis including uterus, ovaries, adnexal regions, and pelvic cul-de-sac. It was necessary to proceed with endovaginal exam following the transabdominal exam to visualize the uterus, endometrium, and ovaries. Color and duplex Doppler ultrasound was utilized to evaluate blood flow to the ovaries. COMPARISON:  None. FINDINGS: Uterus Measurements: 6.2 x 3.7 x 5.5 cm = volume: 65.3 mL. No fibroids or other mass visualized. Endometrium Thickness: 3.6 mm, non thickened.  No focal abnormality visualized. Right ovary Measurements: 4.8 x 2.9 x 2.6 cm = volume: 18.8 mL. Normal appearance/no adnexal mass. Left ovary Measurements: 4.2 x 1.8 x 2.1 = volume: 8.3 mL. Normal appearance/no adnexal mass. Pulsed Doppler evaluation of both ovaries demonstrates normal low-resistance arterial and venous waveforms. Other findings Trace anechoic free fluid. IMPRESSION: Unremarkable pelvic ultrasound.  No evidence of ovarian torsion. Trace anechoic free fluid in the pelvis may be a normal physiologic finding in  a reproductive age female. Electronically Signed   By: Kreg Shropshire M.D.   On: 06/20/2019 04:22      ____________________________________________   PROCEDURES  Procedure(s) performed: None Procedures Critical Care performed:  None ____________________________________________   INITIAL IMPRESSION / ASSESSMENT AND PLAN / ED COURSE   24 y.o. female with no significant past medical history who presents for evaluation of pelvic and abdominal pain.   Tested positive for gonorrhea 9 days ago but only received treatment earlier today.  Patient has positive cervical motion tenderness and left adnexal tenderness.  Will start patient on doxycycline for PID.  Will send patient for transvaginal ultrasound to rule out tubo-ovarian abscess.  Pregnancy test negative.  UA negative.  Labs within normal limits.  No signs of sepsis.    _________________________ 4:42 AM on 06/20/2019  -----------------------------------------  Ultrasound with no evidence of TOA.  Discussed safe sex with patient, treatment of her sexual partner, and sexual abstinence for 48 hours after treatment is complete.  Discussed my standard return precautions and follow-up with her OB/GYN.    As part of my medical decision making, I reviewed the following data within the electronic MEDICAL RECORD NUMBER Nursing notes reviewed and incorporated, Labs reviewed , Old chart reviewed, Radiograph reviewed , Notes from prior ED visits and [place] Controlled Substance Database   Please note:  Patient was evaluated in Emergency Department today for the symptoms described in the history of present illness. Patient was evaluated in the context of the global COVID-19 pandemic, which necessitated consideration that the patient might be at risk for infection with the SARS-CoV-2 virus that causes COVID-19. Institutional protocols and algorithms that pertain to the evaluation of patients at risk for COVID-19 are in a state of rapid change based on information released by regulatory bodies including the CDC and federal and state organizations. These policies and algorithms were followed during the patient's care in the ED.  Some ED evaluations and interventions may be delayed as a result of limited staffing during the pandemic.   ____________________________________________   FINAL CLINICAL IMPRESSION(S) / ED DIAGNOSES   Final diagnoses:  Lower abdominal pain  PID (acute pelvic inflammatory disease)      NEW MEDICATIONS STARTED DURING THIS VISIT:  ED Discharge Orders         Ordered    doxycycline (VIBRAMYCIN) 100 MG capsule  2 times daily     06/20/19 0429    ibuprofen (ADVIL) 800 MG tablet  Every 8 hours PRN     06/20/19 0429    ondansetron (ZOFRAN ODT) 4 MG disintegrating tablet  Every 8 hours PRN     06/20/19 0429           Note:  This document was prepared using Dragon voice recognition software and may  include unintentional dictation errors.    Don Perking, [place], MD 06/20/19 [phone]

## 2020-02-16 IMAGING — US ULTRASOUND ABDOMEN LIMITED
1 series · 14 of 25 positions shown · non-contrast
Comparison: None.

CLINICAL DATA: Epigastric and right upper quadrant pain.  Nausea.

EXAM:
ULTRASOUND ABDOMEN LIMITED RIGHT UPPER QUADRANT

[Series 1: ultrasound abdomen limited · 0.23mm/px · 14 of 34 slices shown]
[im 1/34]
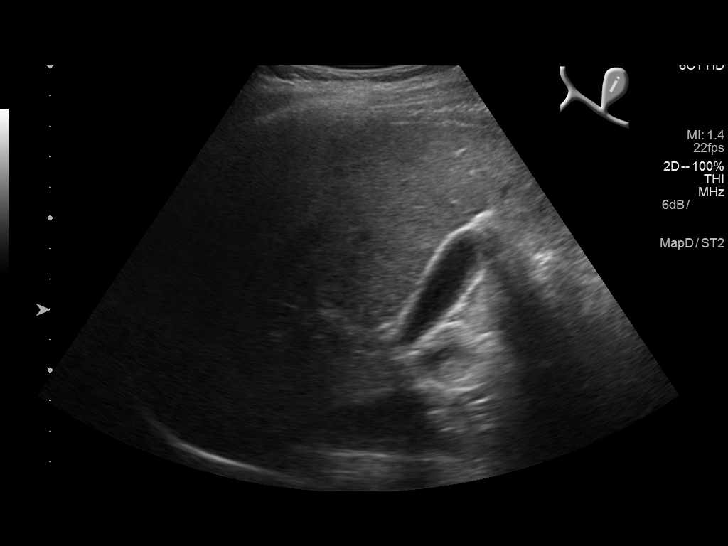
[im 3/34]
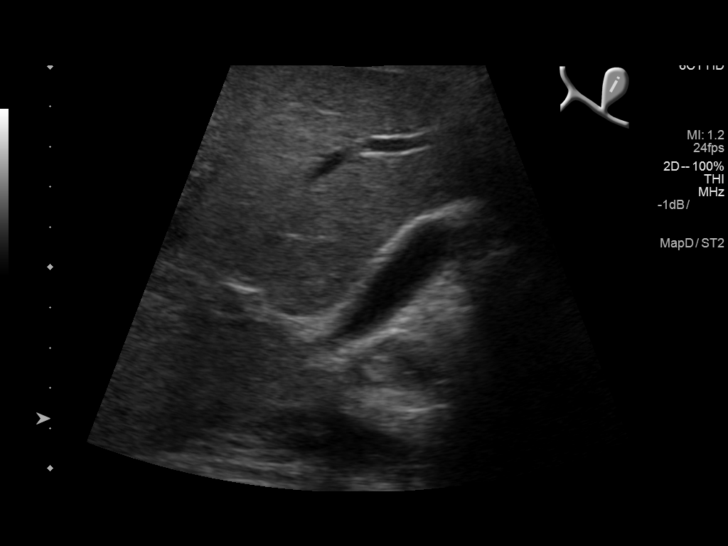
[im 6/34]
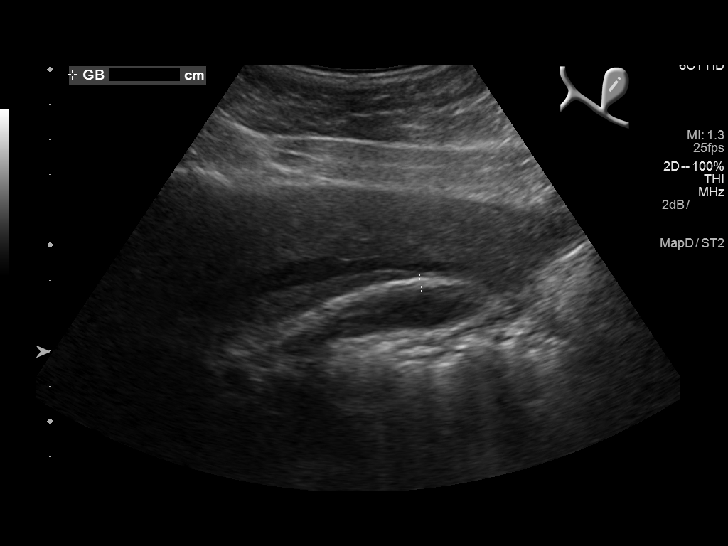
[im 9/34]
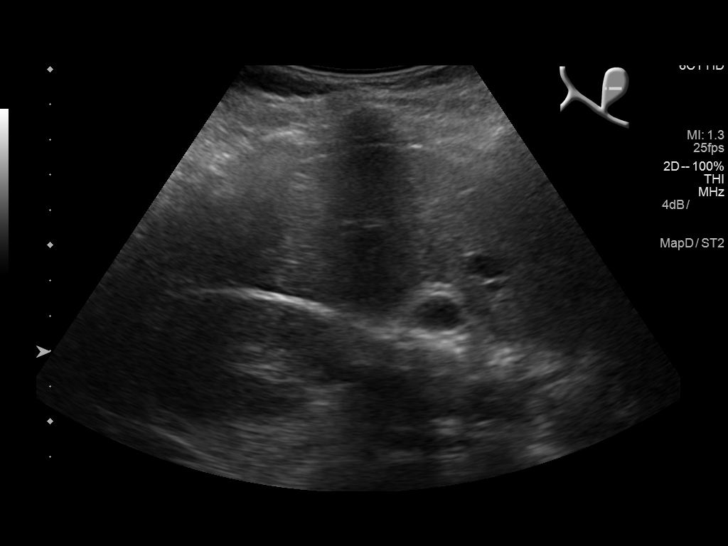
[im 12/34]
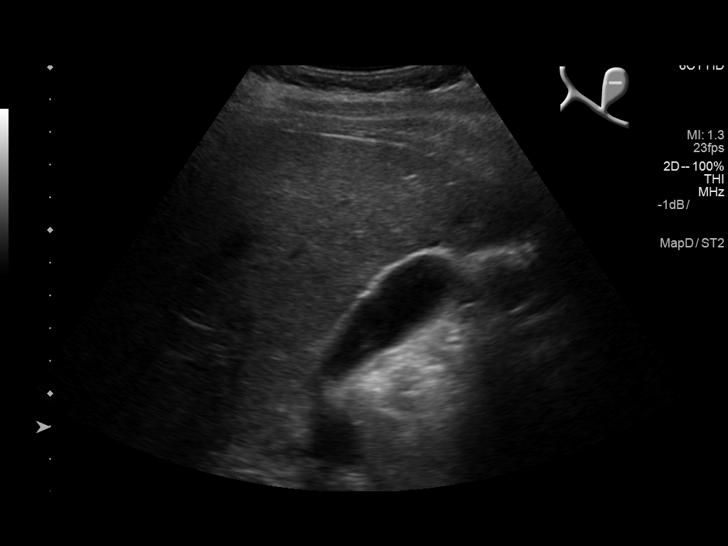
[im 13/34]
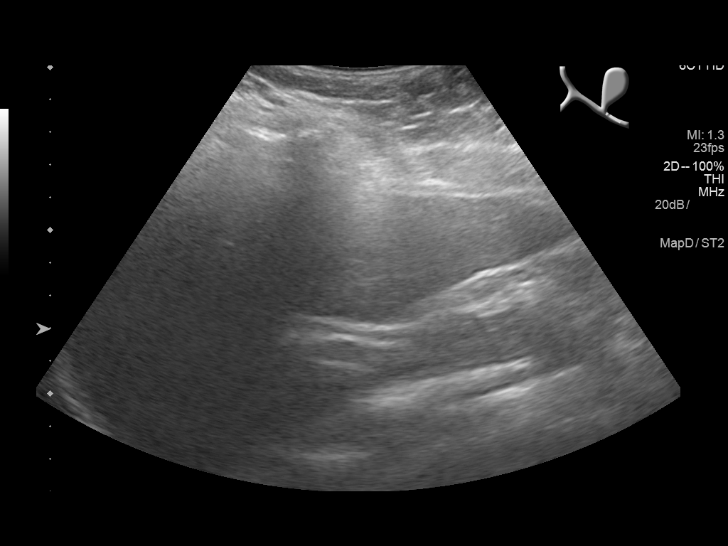
[im 16/34]
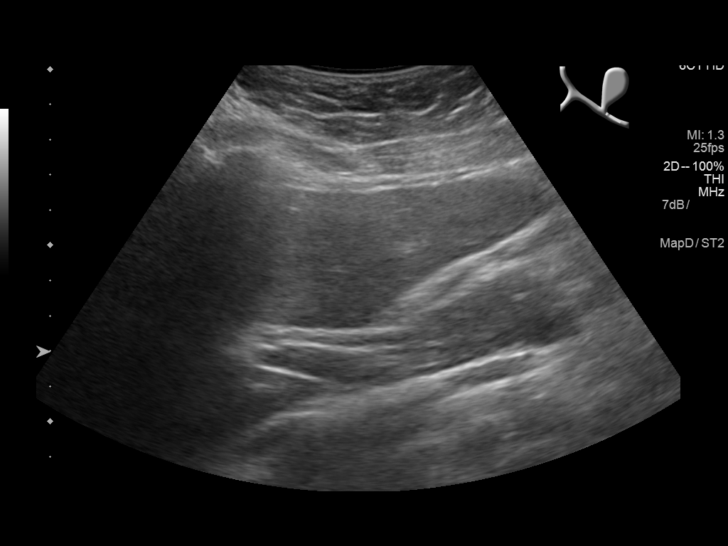
[im 18/34]
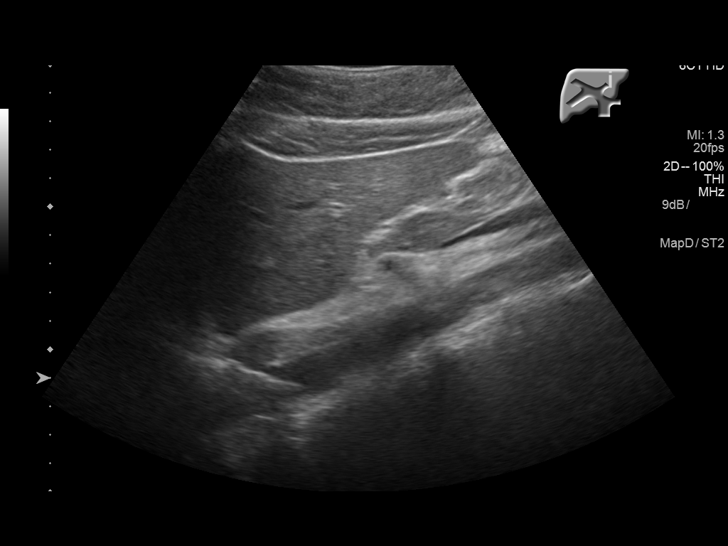
[im 21/34]
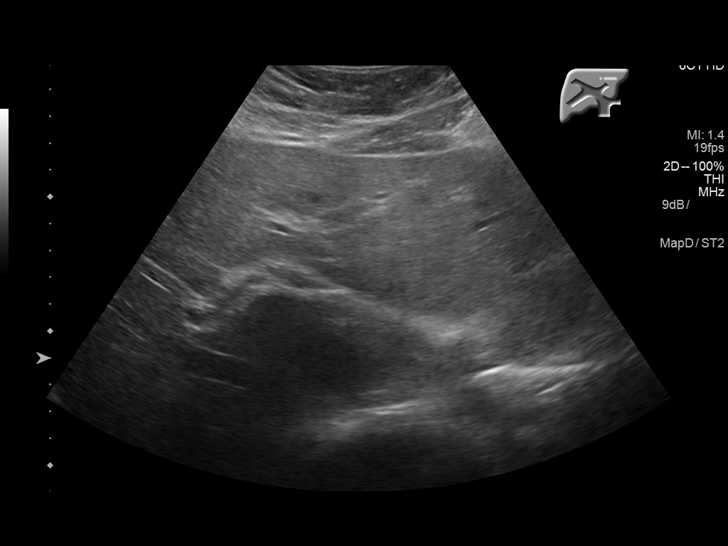
[im 23/34]
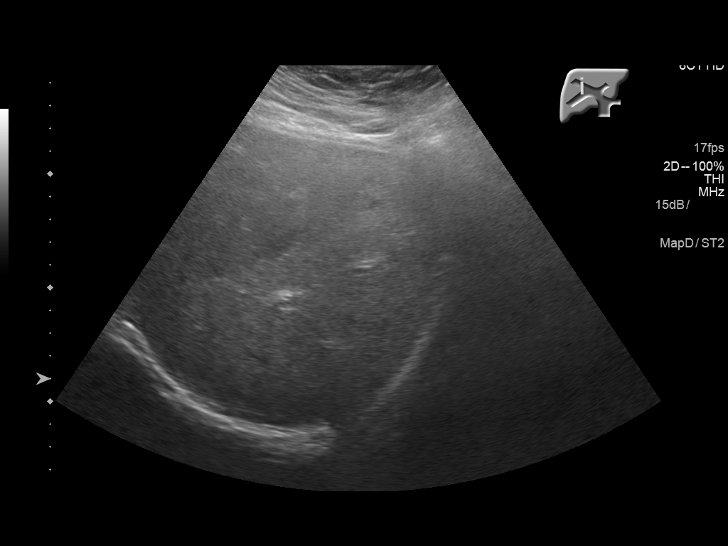
[im 25/34]
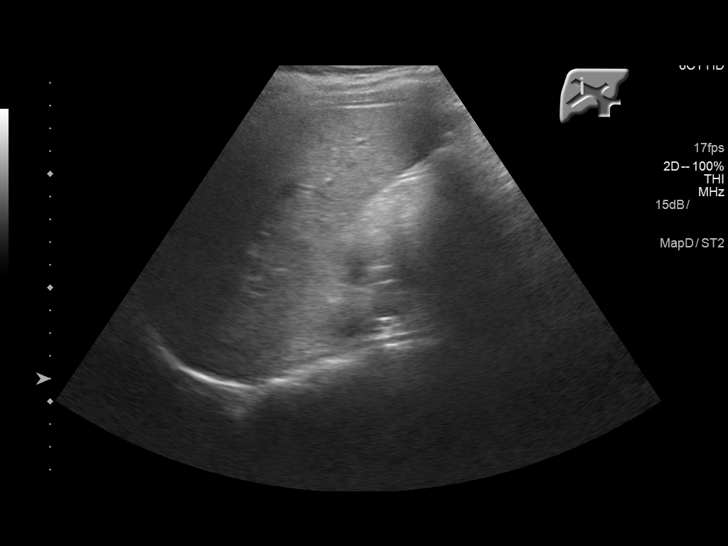
[im 28/34]
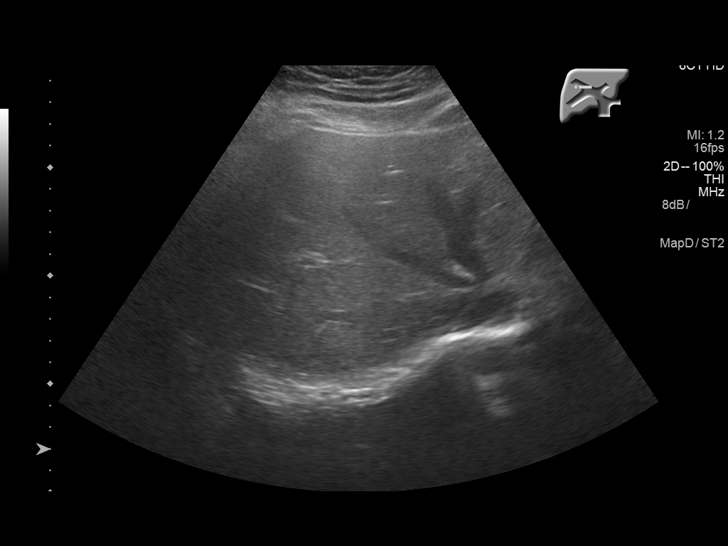
[im 31/34]
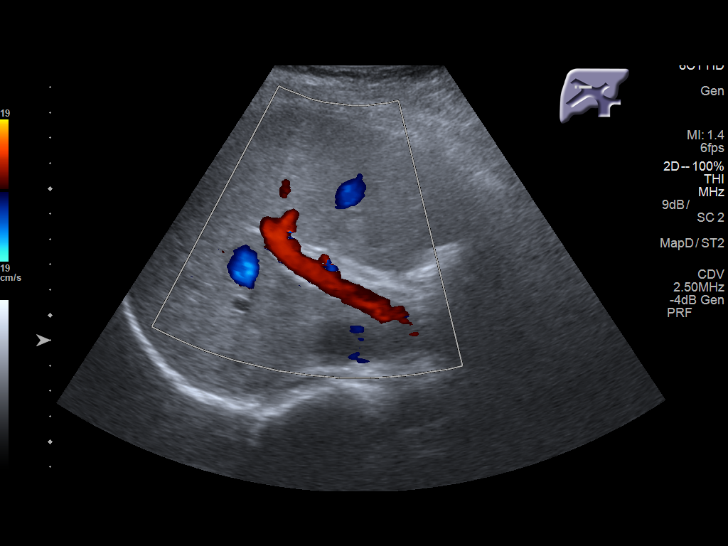
[im 34/34]
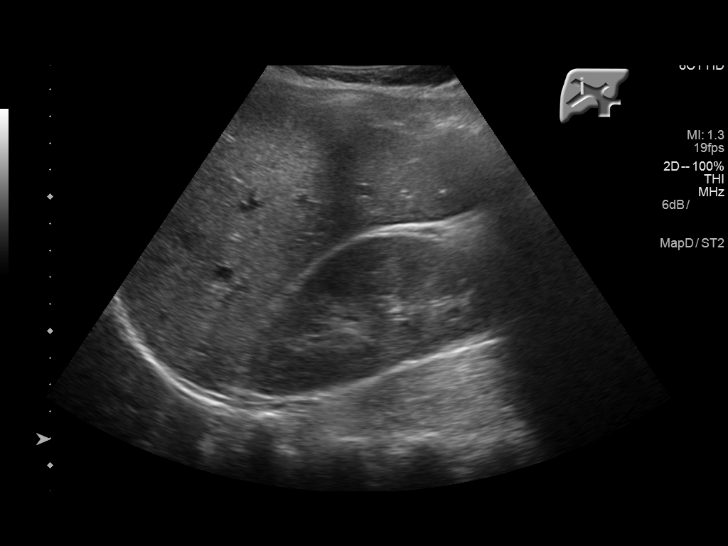

[14 of 25 positions shown; findings below may reference images not displayed]

FINDINGS: Gallbladder:

Only partially distended. No gallstones. Wall thickness of 3.5 mm
likely related to degree of distension. No pericholecystic fluid. No
sonographic Murphy sign noted by sonographer.

Common bile duct:

Diameter: 3 mm.

Liver:

No focal lesion identified. Within normal limits in parenchymal
echogenicity. Portal vein is patent on color Doppler imaging with
normal direction of blood flow towards the liver.
IMPRESSION: No gallstones. Partially distended gallbladder with borderline wall
thickening, likely related to incomplete distension. No biliary
dilatation.

## 2020-08-18 ENCOUNTER — Other Ambulatory Visit: Payer: Self-pay

## 2020-08-18 ENCOUNTER — Emergency Department (HOSPITAL_COMMUNITY)
Admission: EM | Admit: 2020-08-18 | Discharge: 2020-08-18 | Disposition: A | Discharge status: Home / Self Care | Payer: Self-pay | Attending: Emergency Medicine | Admitting: Emergency Medicine

## 2020-08-18 ENCOUNTER — Encounter (HOSPITAL_COMMUNITY): Payer: Self-pay | Admitting: *Deleted

## 2020-08-18 DIAGNOSIS — R350 Frequency of micturition: Secondary | ICD-10-CM | POA: Insufficient documentation

## 2020-08-18 LAB — URINALYSIS, ROUTINE W REFLEX MICROSCOPIC
Bacteria, UA: NONE SEEN
Bilirubin Urine: NEGATIVE
Glucose, UA: NEGATIVE mg/dL
Ketones, ur: NEGATIVE mg/dL
Leukocytes,Ua: NEGATIVE
Nitrite: NEGATIVE
Protein, ur: NEGATIVE mg/dL
Specific Gravity, Urine: 1.019 (ref 1.005–1.030)
pH: 7 (ref 5.0–8.0)

## 2020-08-18 LAB — I-STAT BETA HCG BLOOD, ED (MC, WL, AP ONLY): I-stat hCG, quantitative: <5 m[IU]/mL (ref <5)

## 2020-08-18 LAB — CBG MONITORING, ED: Glucose-Capillary: 93 mg/dL (ref 70–99)

## 2020-08-18 NOTE — ED Provider Notes (Signed)
[facility]-EMERGENCY DEPT Provider Note   CSN: [id number] Arrival date & time: 08/18/20  2018     History Chief Complaint  Patient presents with  . Urinary Frequency    Monica Strickland is a 25 y.o. female.  HPI   Patient presents to the ED for evaluation of urinary frequency.  Patient states he has been having this issue off and on for several months now.  She feels like she will have an urge to urinate.  She will go and then shortly thereafter she feels like she has to go again.  Its not a constant issue but it does seem to come and go frequently.  She is not having any burning when she urinates.  She does not have any vaginal discharge or bleeding.  She denies any fevers or chills.  Patient states she has has not seen an OB/GYN or urologist for this issue  History reviewed. No pertinent past medical history.  There are no problems to display for this patient.   Past Surgical History:  Procedure Laterality Date  . NO PAST SURGERIES       OB History   No obstetric history on file.     Family History  Problem Relation Age of Onset  . Healthy Mother   . Healthy Father     Social History   Tobacco Use  . Smoking status: Never Smoker  . Smokeless tobacco: Never Used  Vaping Use  . Vaping Use: Never used  Substance Use Topics  . Alcohol use: No    Alcohol/week: 0.0 standard drinks  . Drug use: No    Home Medications Prior to Admission medications   Medication Sig Start Date End Date Taking? Authorizing Provider  ibuprofen (ADVIL) 800 MG tablet Take 1 tablet (800 mg total) by mouth every 8 (eight) hours as needed. 06/20/19   Nita Sickle, MD  ondansetron (ZOFRAN ODT) 4 MG disintegrating tablet Take 1 tablet (4 mg total) by mouth every 8 (eight) hours as needed. 06/20/19   Nita Sickle, MD    Allergies    Patient has no known allergies.  Review of Systems   Review of Systems  All other systems reviewed and are  negative.   Physical Exam Updated Vital Signs BP 125/84 (BP Location: Right Arm)   Pulse 82   Temp 98.9 F (37.2 C) (Oral)   Resp 18   Ht 1.778 m (5\' 10" )   Wt 89.8 kg   SpO2 98%   BMI 28.41 kg/m   Physical Exam Vitals and nursing note reviewed.  Constitutional:      General: She is not in acute distress.    Appearance: She is well-developed.  HENT:     Head: Normocephalic and atraumatic.     Right Ear: External ear normal.     Left Ear: External ear normal.  Eyes:     General: No scleral icterus.       Right eye: No discharge.        Left eye: No discharge.     Conjunctiva/sclera: Conjunctivae normal.  Neck:     Trachea: No tracheal deviation.  Cardiovascular:     Rate and Rhythm: Normal rate and regular rhythm.  Pulmonary:     Effort: Pulmonary effort is normal. No respiratory distress.     Breath sounds: Normal breath sounds. No stridor. No wheezing or rales.  Abdominal:     General: Bowel sounds are normal. There is no distension.     Palpations:  Abdomen is soft.     Tenderness: There is no abdominal tenderness. There is no guarding or rebound.  Musculoskeletal:        General: No tenderness.     Cervical back: Neck supple.  Skin:    General: Skin is warm and dry.     Findings: No rash.  Neurological:     Mental Status: She is alert.     Cranial Nerves: No cranial nerve deficit (no facial droop, extraocular movements intact, no slurred speech).     Sensory: No sensory deficit.     Motor: No abnormal muscle tone or seizure activity.     Coordination: Coordination normal.     ED Results / Procedures / Treatments   Labs (all labs ordered are listed, but only abnormal results are displayed) Labs Reviewed  URINALYSIS, ROUTINE W REFLEX MICROSCOPIC - Abnormal; Notable for the following components:      Result Value   Hgb urine dipstick SMALL (*)    All other components within normal limits  URINE CULTURE  I-STAT BETA HCG BLOOD, ED ([facility], [facility], [facility] ONLY)  CBG  MONITORING, ED    EKG None  Radiology No results found.  Procedures Procedures   Medications Ordered in ED Medications - No data to display  ED Course  I have reviewed the triage vital signs and the nursing notes.  Pertinent labs & imaging results that were available during my care of the patient were reviewed by me and considered in my medical decision making (see chart for details).  Clinical Course as of 08/18/20 2318  Mon Aug 18, 2020  2316 Bladder scan volume 96 [JK]    Clinical Course User Index [JK] Linwood Dibbles, MD   MDM Rules/Calculators/A&P                          Patient presented to the ED for evaluation of urinary discomfort.  Patient states she has been having urinary frequency.  Patient's evaluation today is reassuring.  She is not having any abdominal pain.  I doubt PID or cystitis.  Urinalysis does not show signs of infection.  Bladder scan also does not show evidence of urinary retention.  Recommend outpatient follow-up with gynecologist or urologist.  May need evaluation for initial cystitis  Final Clinical Impression(s) / ED Diagnoses Final diagnoses:  Urinary frequency    Rx / DC Orders ED Discharge Orders    None       Linwood Dibbles, MD 08/18/20 2319

## 2020-08-18 NOTE — ED Notes (Addendum)
Pt. CBG 93, RN, Milagros Loll made aware.

## 2020-08-18 NOTE — ED Triage Notes (Signed)
Emergency Medicine Provider Triage Evaluation Note  Monica Strickland , a 25 y.o. female  was evaluated in triage.  Pt complains of urinary frequency.  She states has been happening for the past few months.  She states it happens throughout the day and also at night.  Denies any polydipsia.  No increase in baseline vaginal discharge.  No dysuria or hematuria.  No history of diabetes mellitus, per patient.  Physical Exam  BP 121/79 (BP Location: Left Arm)   Pulse 71   Temp 98.9 F (37.2 C) (Oral)   Resp 16   Ht 5\' 10"  (1.778 m)   Wt 89.8 kg   SpO2 98%   BMI 28.41 kg/m  Gen:   Awake, no distress   HEENT:  Atraumatic  Resp:  Normal effort  Cardiac:  Normal rate  Abd:   Nondistended, nontender  MSK:   Moves extremities without difficulty  Neuro:  Speech clear   Medical Decision Making  Medically screening exam initiated at 9:31 PM.  Appropriate orders placed.  Monica Strickland was informed that the remainder of the evaluation will be completed by another provider, this initial triage assessment does not replace that evaluation, and the importance of remaining in the ED until their evaluation is complete.   John Giovanni, PA-C 08/18/20 2132

## 2020-08-18 NOTE — ED Triage Notes (Signed)
Pt has been having "bladder problems".  For the past 4-5 months she has been having urgency, frequency and pressure.  She has been checked for UTI and STD and it has all been negative.

## 2020-08-18 NOTE — Discharge Instructions (Signed)
Follow-up with a urologist or gynecologist for further evaluation.  Tests today did not show any signs of infection.  Your bladder is emptying properly

## 2020-08-20 LAB — URINE CULTURE

## 2020-10-31 ENCOUNTER — Emergency Department (HOSPITAL_COMMUNITY)
Admission: EM | Admit: 2020-10-31 | Discharge: 2020-11-01 | Disposition: A | Discharge status: Home / Self Care | Payer: Self-pay | Attending: Emergency Medicine | Admitting: Emergency Medicine

## 2020-10-31 ENCOUNTER — Other Ambulatory Visit: Payer: Self-pay

## 2020-10-31 DIAGNOSIS — L02411 Cutaneous abscess of right axilla: Secondary | ICD-10-CM | POA: Insufficient documentation

## 2020-10-31 DIAGNOSIS — L729 Follicular cyst of the skin and subcutaneous tissue, unspecified: Secondary | ICD-10-CM | POA: Insufficient documentation

## 2020-10-31 MED ORDER — LIDOCAINE HCL 2 % IJ SOLN: 10.0000 mL | Freq: Once | INTRAMUSCULAR | Status: DC

## 2020-10-31 NOTE — ED Triage Notes (Signed)
Pt came in with abscess R axillary. Started approx one week ago

## 2020-10-31 NOTE — ED Provider Notes (Signed)
Emergency Medicine Provider Triage Evaluation Note  Monica Strickland , a 25 y.o. female  was evaluated in triage.  Pt complains of an abscess to the right axilla that has been present for a few weeks but started to become more painful this week.  Review of Systems  Positive: abscess Negative: fever  Physical Exam  BP (!) 145/77 (BP Location: Left Arm)   Pulse 72   Temp 98.7 F (37.1 C) (Oral)   Resp 16   Ht 5\' 10"  (1.778 m)   Wt 90.7 kg   SpO2 100%   BMI 28.70 kg/m  Gen:   Awake, no distress   Resp:  Normal effort  MSK:   Moves extremities without difficulty  Other:    Medical Decision Making  Medically screening exam initiated at 7:57 PM.  Appropriate orders placed.  Elleah Hemsley was informed that the remainder of the evaluation will be completed by another provider, this initial triage assessment does not replace that evaluation, and the importance of remaining in the ED until their evaluation is complete.     John Giovanni, PA-C 10/31/20 1957    01/01/21, DO 10/31/20 2352

## 2020-11-01 MED ORDER — LIDOCAINE-EPINEPHRINE 2 %-1:100000 IJ SOLN
20.0000 mL | Freq: Once | INTRAMUSCULAR | Status: AC
  Administered 2020-11-01: 20 mL via INTRADERMAL
  Filled 2020-11-01: qty 1

## 2020-11-01 NOTE — ED Provider Notes (Signed)
[facility]-EMERGENCY DEPT Provider Note   CSN: [id number] Arrival date & time: 10/31/20  1929     History Chief Complaint  Patient presents with   Abscess    Monica Strickland is a 25 y.o. female with no chronic medical conditions who presents the emergency department with a chief complaint of abscess.  The patient has had a constant, worsening abscess to the right axilla for the last few weeks.  However, the area has become increasingly painful and enlarging over the last few days.  Pain is worse with moving her arm and improved with holding it still.  No other known aggravating or alleviating factors.  No fever, chills, red streaking, shoulder pain, numbness, or weakness.  No treatment prior to arrival.  She is a non-smoker.  She is not immunocompromise.  The history is provided by the patient and medical records. No language interpreter was used.      No past medical history on file.  There are no problems to display for this patient.   Past Surgical History:  Procedure Laterality Date   NO PAST SURGERIES       OB History   No obstetric history on file.     Family History  Problem Relation Age of Onset   Healthy Mother    Healthy Father     Social History   Tobacco Use   Smoking status: Never   Smokeless tobacco: Never  Vaping Use   Vaping Use: Never used  Substance Use Topics   Alcohol use: No    Alcohol/week: 0.0 standard drinks   Drug use: No    Home Medications Prior to Admission medications   Medication Sig Start Date End Date Taking? Authorizing Provider  ibuprofen (ADVIL) 800 MG tablet Take 1 tablet (800 mg total) by mouth every 8 (eight) hours as needed. 06/20/19   Nita Sickle, MD  ondansetron (ZOFRAN ODT) 4 MG disintegrating tablet Take 1 tablet (4 mg total) by mouth every 8 (eight) hours as needed. 06/20/19   Nita Sickle, MD    Allergies    Patient has no known allergies.  Review of Systems   Review of Systems   Constitutional:  Negative for activity change, chills and fever.  HENT:  Negative for congestion and sore throat.   Eyes:  Negative for visual disturbance.  Respiratory:  Negative for shortness of breath and wheezing.   Cardiovascular:  Negative for chest pain and palpitations.  Gastrointestinal:  Negative for abdominal pain, diarrhea, nausea and vomiting.  Musculoskeletal:  Positive for myalgias. Negative for arthralgias and back pain.  Skin:  Positive for color change and wound. Negative for rash.  Neurological:  Negative for seizures, syncope, weakness and numbness.   Physical Exam Updated Vital Signs BP (!) 147/80   Pulse 61   Temp 98.7 F (37.1 C) (Oral)   Resp 16   Ht 5\' 10"  (1.778 m)   Wt 90.7 kg   SpO2 99%   BMI 28.70 kg/m   Physical Exam Vitals and nursing note reviewed.  Constitutional:      General: She is not in acute distress. HENT:     Head: Normocephalic.  Eyes:     Conjunctiva/sclera: Conjunctivae normal.  Cardiovascular:     Rate and Rhythm: Normal rate and regular rhythm.     Heart sounds: No murmur heard.   No friction rub. No gallop.  Pulmonary:     Effort: Pulmonary effort is normal. No respiratory distress.  Abdominal:  General: There is no distension.     Palpations: Abdomen is soft.  Musculoskeletal:     Cervical back: Neck supple.  Skin:    General: Skin is warm.     Findings: No rash.     Comments: There is a 1 x 4 cm area of fluctuance with overlying redness noted to the right axilla.  No surrounding erythema, edema, warmth.  Minimal induration.  No red streaking.  Neurological:     Mental Status: She is alert.  Psychiatric:        Behavior: Behavior normal.    ED Results / Procedures / Treatments   Labs (all labs ordered are listed, but only abnormal results are displayed) Labs Reviewed - No data to display  EKG None  Radiology No results found.  Procedures .Marland KitchenIncision and Drainage  Date/Time: 11/01/2020 4:15  AM Performed by: Barkley Boards, PA-C Authorized by: Barkley Boards, PA-C   Consent:    Consent obtained:  Verbal   Consent given by:  Patient   Risks discussed:  Bleeding, incomplete drainage, pain and damage to other organs   Alternatives discussed:  No treatment Universal protocol:    Patient identity confirmed:  Verbally with patient Location:    Type:  Cyst   Size:  4 cm Pre-procedure details:    Skin preparation:  Povidone-iodine Sedation:    Sedation type:  None Anesthesia:    Anesthesia method:  Local infiltration   Local anesthetic:  Lidocaine 2% WITH epi Procedure type:    Complexity:  Simple Procedure details:    Incision types:  Single straight   Incision depth:  Dermal   Wound management:  Probed and deloculated   Drainage:  Serosanguinous   Drainage amount:  Moderate   Wound treatment:  Wound left open   Packing materials:  None Post-procedure details:    Procedure completion:  Tolerated well, no immediate complications   Medications Ordered in ED Medications  lidocaine-EPINEPHrine (XYLOCAINE W/EPI) 2 %-1:100000 (with pres) injection 20 mL (has no administration in time range)    ED Course  I have reviewed the triage vital signs and the nursing notes.  Pertinent labs & imaging results that were available during my care of the patient were reviewed by me and considered in my medical decision making (see chart for details).    MDM Rules/Calculators/A&P                          25 year old otherwise healthy female who presents the emergency department with a chief complaint of abscess to the right axilla for the last few weeks that has worsened over the last few days.  No constitutional symptoms.  On exam, there is a 1 x 4 cm area of fluctuance that is amenable to incision and drainage.  Vital signs are stable.  Incision and drainage performed with serosanguineous drainage.  No purulence to suggest abscess.  Exam is more concerning with cyst.   Recommended daily dressing changes and warm compresses.  She can follow-up with primary care for reevaluation.  ER return precautions given.  She is hemodynamically stable in no acute distress.  Safer discharge home with outpatient follow-up as needed.   Final Clinical Impression(s) / ED Diagnoses Final diagnoses:  None    Rx / DC Orders ED Discharge Orders     None        Samari Bittinger A, PA-C 11/01/20 0418    Geoffery Lyons, MD 11/01/20 2304

## 2020-11-01 NOTE — Discharge Instructions (Addendum)
Thank you for allowing me to care for you today in the Emergency Department.   You had an incision and drainage today for an abscess to your right armpit.  This may continue to ooze over the next few days.  You can apply a dressing to the armpit with the supplies that were given in the emergency department.  Change this at least once daily.  You can flush the area with water when you are in the shower to keep it clean.   You can apply warm compresses for 15 to 20 minutes up to 3-4 times a day now that it is draining to help the rest of the area resolved.  You can take Motrin and Tylenol as needed for pain.  Avoid shaving the area or using deodorant until the wound has healed.  Return to the emergency department if you start having fevers, chills, red streaking, significantly worsening swelling, redness, thick, mucus-like drainage from the area, or other new, concerning symptoms.
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
## 2021-06-04 IMAGING — US US PELVIS COMPLETE TRANSABD/TRANSVAG W DUPLEX
1 series · 13 of 25 positions shown · non-contrast
Comparison: None.

CLINICAL DATA: Lower abdominal pain for 4 days, negative UPT, LMP
06/12/2019

EXAM:
TRANSABDOMINAL AND TRANSVAGINAL ULTRASOUND OF PELVIS
DOPPLER ULTRASOUND OF OVARIES
TECHNIQUE: Both transabdominal and transvaginal ultrasound examinations of the
pelvis were performed. Transabdominal technique was performed for
global imaging of the pelvis including uterus, ovaries, adnexal
regions, and pelvic cul-de-sac.
It was necessary to proceed with endovaginal exam following the
transabdominal exam to visualize the uterus, endometrium, and
ovaries. Color and duplex Doppler ultrasound was utilized to
evaluate blood flow to the ovaries.

[Series 1: us pelvis complete transabd/transvag w duplex · 13 of 109 slices shown]
[im 1/109]
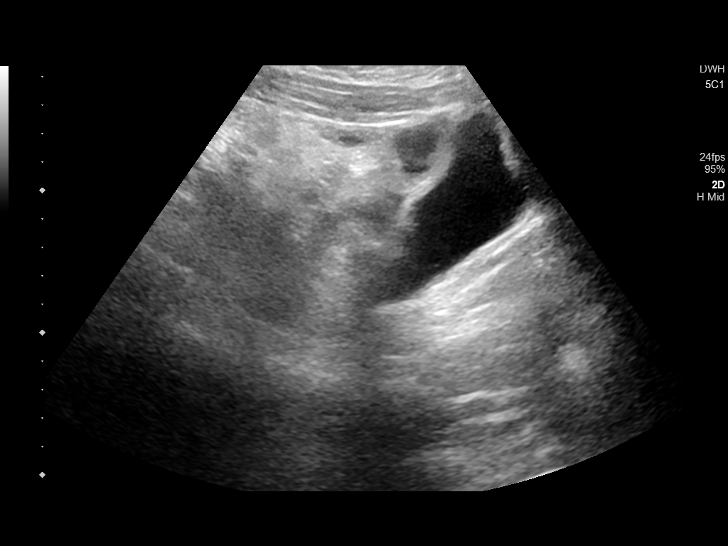
[im 10/109]
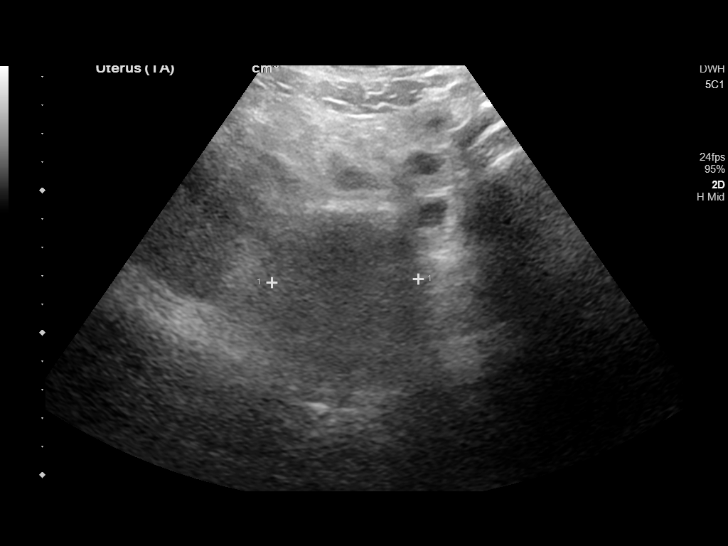
[im 19/109]
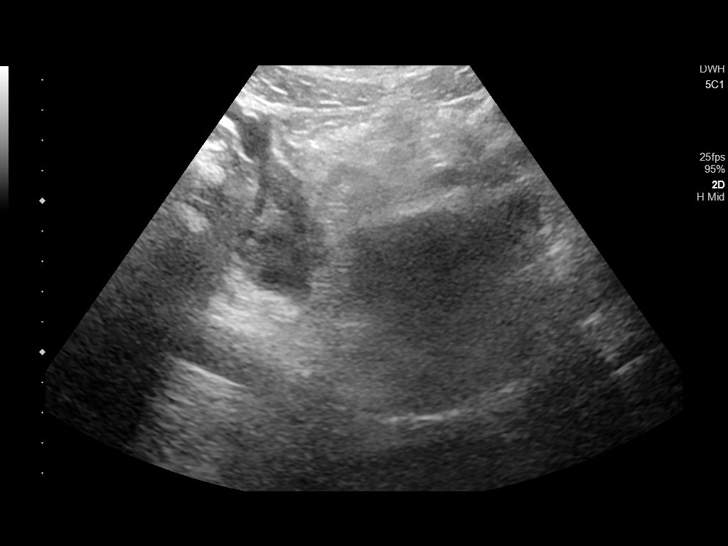
[im 28/109]
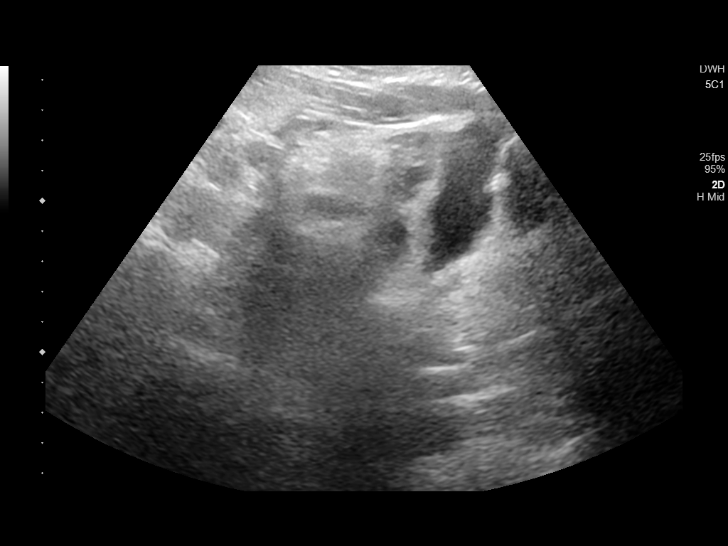
[im 37/109]
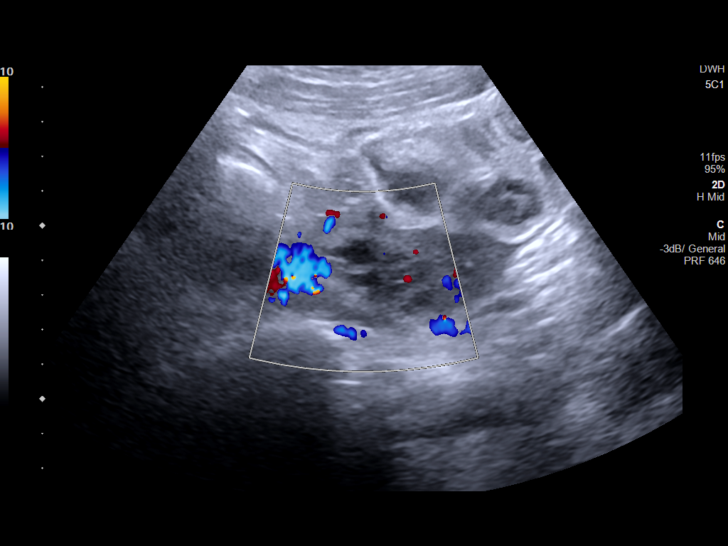
[im 46/109]
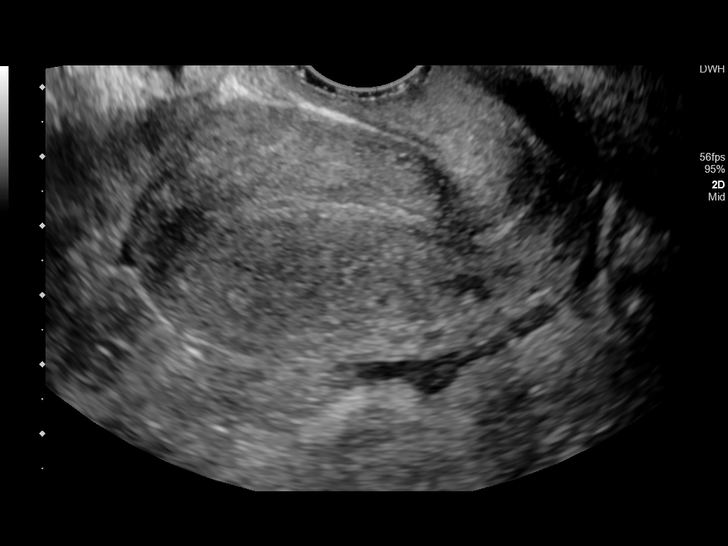
[im 55/109]
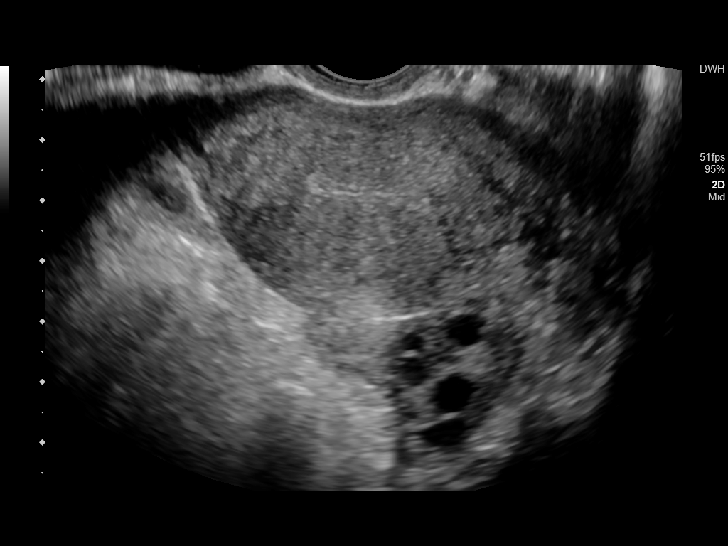
[im 64/109]
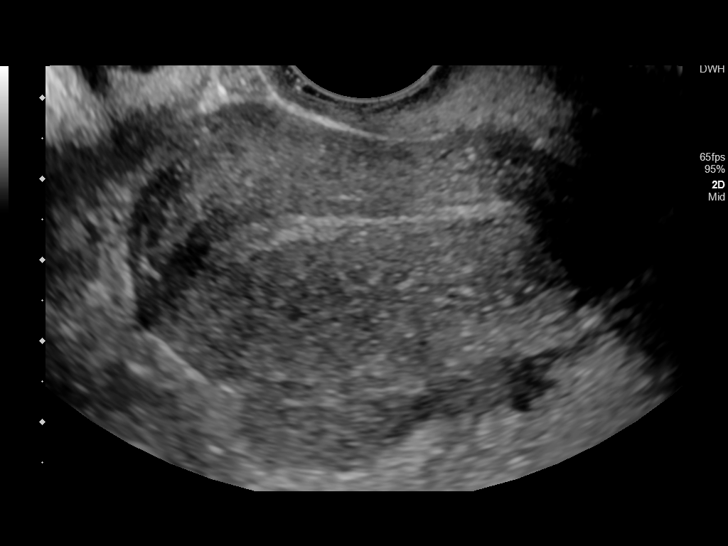
[im 73/109]
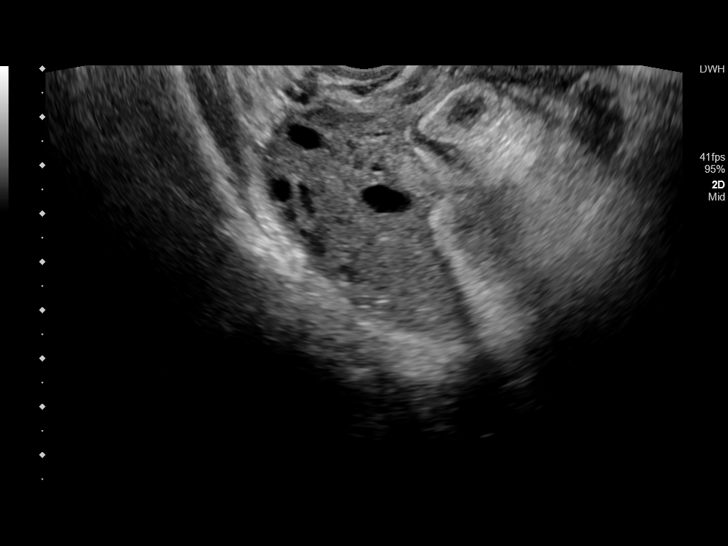
[im 82/109]
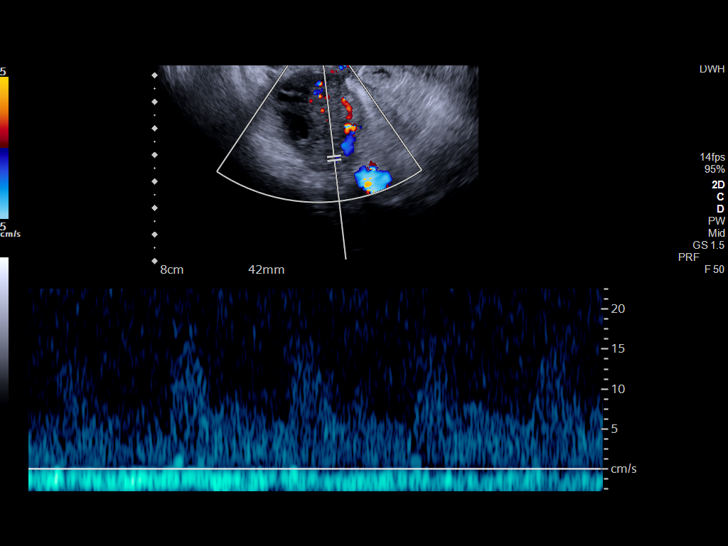
[im 91/109]
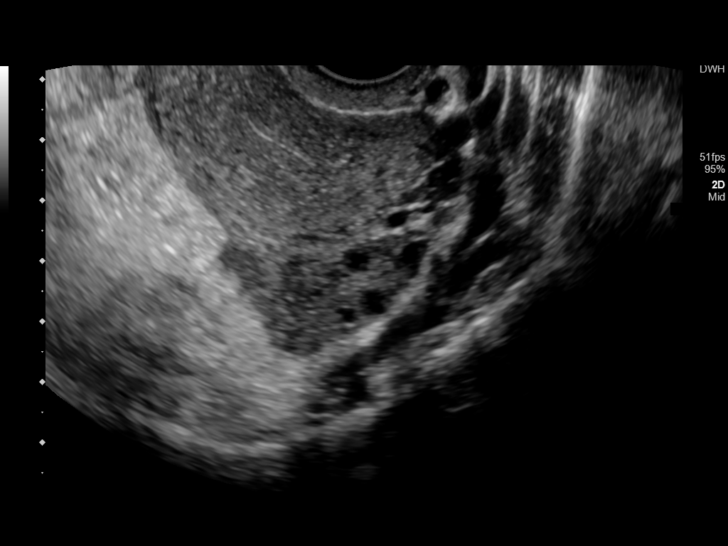
[im 100/109]
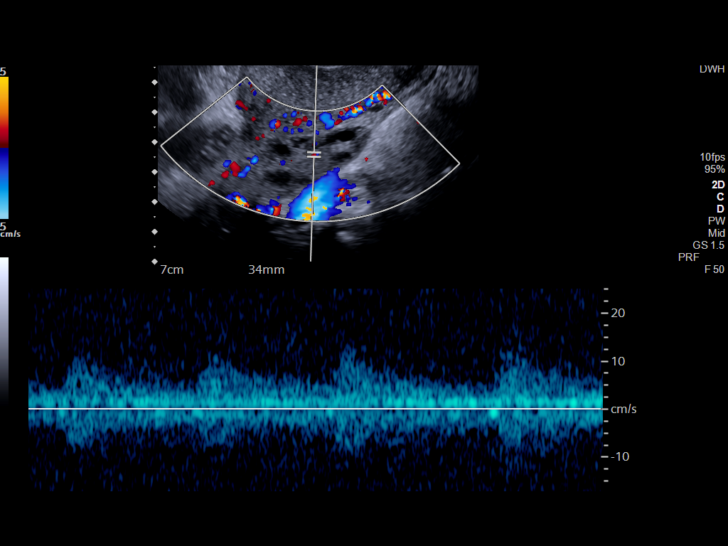
[im 109/109]
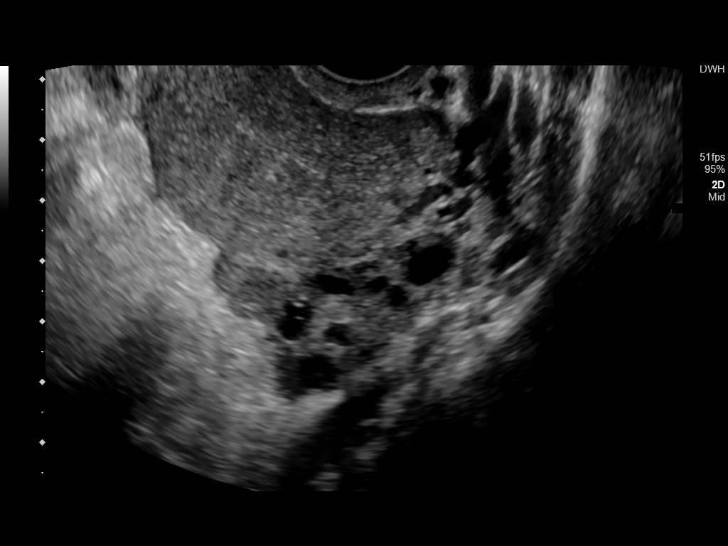

[13 of 25 positions shown; findings below may reference images not displayed]

FINDINGS: Uterus

Measurements: 6.2 x 3.7 x 5.5 cm = volume: 65.3 mL. No fibroids or
other mass visualized.

Endometrium

Thickness: 3.6 mm, non thickened.  No focal abnormality visualized.

Right ovary

Measurements: 4.8 x 2.9 x 2.6 cm = volume: 18.8 mL. Normal
appearance/no adnexal mass.

Left ovary

Measurements: 4.2 x 1.8 x 2.1 = volume: 8.3 mL. Normal appearance/no
adnexal mass.

Pulsed Doppler evaluation of both ovaries demonstrates normal
low-resistance arterial and venous waveforms.

Other findings

Trace anechoic free fluid.
IMPRESSION: Unremarkable pelvic ultrasound.  No evidence of ovarian torsion.

Trace anechoic free fluid in the pelvis may be a normal physiologic
finding in a reproductive age female.

## 2022-02-09 ENCOUNTER — Emergency Department
Admission: EM | Admit: 2022-02-09 | Discharge: 2022-02-09 | Disposition: A | Discharge status: Home / Self Care | Payer: BC Managed Care – PPO | Attending: Emergency Medicine | Admitting: Emergency Medicine

## 2022-02-09 ENCOUNTER — Other Ambulatory Visit: Payer: Self-pay

## 2022-02-09 ENCOUNTER — Emergency Department: Payer: BC Managed Care – PPO

## 2022-02-09 DIAGNOSIS — R0789 Other chest pain: Secondary | ICD-10-CM | POA: Diagnosis not present

## 2022-02-09 DIAGNOSIS — Z1152 Encounter for screening for COVID-19: Secondary | ICD-10-CM | POA: Diagnosis not present

## 2022-02-09 DIAGNOSIS — R051 Acute cough: Secondary | ICD-10-CM | POA: Diagnosis not present

## 2022-02-09 DIAGNOSIS — J029 Acute pharyngitis, unspecified: Secondary | ICD-10-CM | POA: Diagnosis present

## 2022-02-09 LAB — BASIC METABOLIC PANEL
Anion gap: 5 (ref 5–15)
BUN: 9 mg/dL (ref 6–20)
CO2: 27 mmol/L (ref 22–32)
Calcium: 9.1 mg/dL (ref 8.9–10.3)
Chloride: 106 mmol/L (ref 98–111)
Creatinine, Ser: 0.71 mg/dL (ref 0.44–1.00)
GFR, Estimated: >60 mL/min (ref >60)
Glucose, Bld: 91 mg/dL (ref 70–99)
Potassium: 3.9 mmol/L (ref 3.5–5.1)
Sodium: 138 mmol/L (ref 135–145)

## 2022-02-09 LAB — GROUP A STREP BY PCR: Group A Strep by PCR: NOT DETECTED

## 2022-02-09 LAB — CBC
HCT: 36.6 % (ref 36.0–46.0)
Hemoglobin: 12.2 g/dL (ref 12.0–15.0)
MCH: 25.1 pg — ABNORMAL LOW (ref 26.0–34.0)
MCHC: 33.3 g/dL (ref 30.0–36.0)
MCV: 75.2 fL — ABNORMAL LOW (ref 80.0–100.0)
Platelets: 246 10*3/uL (ref 150–400)
RBC: 4.87 MIL/uL (ref 3.87–5.11)
RDW: 15.8 % — ABNORMAL HIGH (ref 11.5–15.5)
WBC: 6.7 10*3/uL (ref 4.0–10.5)
nRBC: 0 % (ref 0.0–0.2)

## 2022-02-09 LAB — TROPONIN I (HIGH SENSITIVITY): Troponin I (High Sensitivity): <2 ng/L (ref <18)

## 2022-02-09 LAB — RESP PANEL BY RT-PCR (FLU A&B, COVID) ARPGX2
Influenza A by PCR: NEGATIVE
Influenza B by PCR: NEGATIVE
SARS Coronavirus 2 by RT PCR: NEGATIVE

## 2022-02-09 LAB — POC URINE PREG, ED: Preg Test, Ur: NEGATIVE

## 2022-02-09 MED ORDER — PREDNISONE 10 MG (21) PO TBPK: ORAL_TABLET | ORAL | 0 refills | Status: DC

## 2022-02-09 MED ORDER — IBUPROFEN 600 MG PO TABS: 600.0000 mg | ORAL_TABLET | Freq: Four times a day (QID) | ORAL | 0 refills | Status: AC | PRN

## 2022-02-09 MED ORDER — BENZONATATE 100 MG PO CAPS: 100.0000 mg | ORAL_CAPSULE | Freq: Three times a day (TID) | ORAL | 0 refills | Status: DC | PRN

## 2022-02-09 NOTE — Discharge Instructions (Signed)
Return to the ER for worsening shortness of breath fevers or any other concern.  Otherwise you can take Tylenol 1 g every 8 hours with the medications prescribed to help with your symptoms.

## 2022-02-09 NOTE — ED Provider Notes (Signed)
[facility] Provider Note    Event Date/Time   First MD Initiated Contact with Patient 02/09/22 1157     (approximate)   History   Chest Pain and Cough   HPI  Monica Strickland is a 26 y.o. female  who comes in with sore throat on Saturday and voice is more hoarse.  She reports a cough as well and then having some chest tightness with it. 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  Feels intermittent short of breath for a few seconds. NO birth control, recent long travel or surg, no leg swelling      Physical Exam   Triage Vital Signs: ED Triage Vitals  Enc Vitals Group     BP 02/09/22 1125 127/86     Pulse Rate 02/09/22 1125 (!) 58     Resp 02/09/22 1125 16     Temp 02/09/22 1125 97.8 F (36.6 C)     Temp Source 02/09/22 1125 Oral     SpO2 02/09/22 1125 97 %     Weight 02/09/22 1128 199 lb 15.3 oz (90.7 kg)     Height 02/09/22 1128 5\' 10"  (1.778 m)     Head Circumference --      Peak Flow --      Pain Score 02/09/22 1128 8     Pain Loc --      Pain Edu? --      Excl. in [place]? --     Most recent vital signs: Vitals:   02/09/22 1125  BP: 127/86  Pulse: (!) 58  Resp: 16  Temp: 97.8 F (36.6 C)  SpO2: 97%     General: Awake, no distress.  CV:  Good peripheral perfusion.  Resp:  Normal effort.  Abd:  No distention.  Other:  No swelling in legs  OP is clear- Uvula midline.    ED Results / Procedures / Treatments   Labs (all labs ordered are listed, but only abnormal results are displayed) Labs Reviewed  CBC - Abnormal; Notable for the following components:      Result Value   MCV 75.2 (*)    MCH 25.1 (*)    RDW 15.8 (*)    All other components within normal limits  RESP PANEL BY RT-PCR (FLU A&B, COVID) ARPGX2  BASIC METABOLIC PANEL  POC URINE PREG, ED  TROPONIN I (HIGH SENSITIVITY)     EKG  My interpretation of EKG:  Normal sinus no st elevation, no twi normal intervals    RADIOLOGY I have reviewed the xray personally and interpretted and no PNA   PROCEDURES:  Critical Care performed: No  Procedures   MEDICATIONS ORDERED IN ED: Medications - No data to display   IMPRESSION / MDM / [place] / ED COURSE  I reviewed the triage vital signs and the nursing notes.   Patient's presentation is most consistent with acute presentation with potential threat to life or bodily function.   Patient comes with hoarse throat, sore throat, cough, chest pain.  Labs ordered evaluate for ACS.  Patient is PERC negative unlikely pulmonary embolism.  Strep test, COVID, flu were all ordered, chest x-ray to evaluate for any pneumonia, pneumothorax.  CBC normal  Preg test negative  COVID, flu are negative.  BMP reassuring.  Troponin was negative and symptoms have been going on greater than 3 hours does not need repeat   Suspect most likely viral pharyngitis.  We  will just prescribe some medications for cough, ibuprofen and steroids to see if this helps improve symptoms.  She expressed understanding felt comfortable with plan   FINAL CLINICAL IMPRESSION(S) / ED DIAGNOSES   Final diagnoses:  Viral pharyngitis  Acute cough  Atypical chest pain     Rx / DC Orders   ED Discharge Orders          Ordered    ibuprofen (ADVIL) 600 MG tablet  Every 6 hours PRN        02/09/22 1245    benzonatate (TESSALON PERLES) 100 MG capsule  3 times daily PRN        02/09/22 1245    predniSONE (STERAPRED UNI-PAK 21 TAB) 10 MG (21) TBPK tablet        02/09/22 1245             Note:  This document was prepared using Dragon voice recognition software and may include unintentional dictation errors.   Concha Se, MD 02/09/22 1245

## 2022-02-09 NOTE — ED Triage Notes (Signed)
Pt here with a cough, sore throat and CP since Sat. Pt states the cough is dry but she has a lot of mucus that is green. Pt denies N/V. Pt states pain is centered but does not radiate.

## 2022-02-09 NOTE — ED Provider Triage Note (Signed)
Emergency Medicine Provider Triage Evaluation Note  Monica Strickland , a 26 y.o. female  was evaluated in triage.  Pt complains of cough, runny nose, nasal congestion, and chest pain with coughing.  No fever.  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  No leg swelling and does not take birth control pills or exogenous hormones  Review of Systems  Positive: Cough, congestion, chest pain Negative: Abd pain  Physical Exam  BP 127/86 (BP Location: Left Arm)   Pulse (!) 58   Temp 97.8 F (36.6 C) (Oral)   Resp 16   Ht 5\' 10"  (1.778 m)   Wt 90.7 kg   LMP 02/01/2022   SpO2 97%   BMI 28.69 kg/m  Gen:   Awake, no distress   Resp:  Normal effort  MSK:   Moves extremities without difficulty  Other:    Medical Decision Making  Medically screening exam initiated at 11:36 AM.  Appropriate orders placed.  Monica Strickland was informed that the remainder of the evaluation will be completed by another provider, this initial triage assessment does not replace that evaluation, and the importance of remaining in the ED until their evaluation is complete.     Monica Old, PA-C 02/09/22 1137

## 2022-03-16 NOTE — Progress Notes (Unsigned)
   There were no vitals taken for this visit.   Subjective:    Patient ID: Monica Strickland, female    DOB: [birth year], 26 y.o.   MRN: [id number]  HPI: Monica Strickland is a 26 y.o. female  No chief complaint on file. Establish care: Her last physical was ***, her last pap was 06/11/2019.  She has a history of ***.  Family history includes ***.  Relevant past medical, surgical, family and social history reviewed and updated as indicated. Interim medical history since our last visit reviewed. Allergies and medications reviewed and updated.  Review of Systems  Constitutional: Negative for fever or weight change.  Respiratory: Negative for cough and shortness of breath.   Cardiovascular: Negative for chest pain or palpitations.  Gastrointestinal: Negative for abdominal pain, no bowel changes.  Musculoskeletal: Negative for gait problem or joint swelling.  Skin: Negative for rash.  Neurological: Negative for dizziness or headache.  No other specific complaints in a complete review of systems (except as listed in HPI above).      Objective:    There were no vitals taken for this visit.  Wt Readings from Last 3 Encounters:  02/09/22 199 lb 15.3 oz (90.7 kg)  10/31/20 200 lb (90.7 kg)  08/18/20 198 lb (89.8 kg)    Physical Exam  Constitutional: Patient appears well-developed and well-nourished. Obese *** No distress.  HEENT: head atraumatic, normocephalic, pupils equal and reactive to light, ears ***, neck supple, throat within normal limits Cardiovascular: Normal rate, regular rhythm and normal heart sounds.  No murmur heard. No BLE edema. Pulmonary/Chest: Effort normal and breath sounds normal. No respiratory distress. Abdominal: Soft.  There is no tenderness. Psychiatric: Patient has a normal mood and affect. behavior is normal. Judgment and thought content normal.     Assessment & Plan:   Problem List Items Addressed This Visit   None    Follow up plan: No follow-ups on  file.

## 2022-03-17 ENCOUNTER — Encounter: Payer: Self-pay | Admitting: Nurse Practitioner

## 2022-03-17 ENCOUNTER — Ambulatory Visit: Payer: BC Managed Care – PPO | Admitting: Nurse Practitioner

## 2022-03-17 VITALS — BP 120/72 | HR 98 | Temp 98.4°F | Resp 16 | Ht 69.25 in | Wt 190.0 lb

## 2022-03-17 DIAGNOSIS — E663 Overweight: Secondary | ICD-10-CM

## 2022-03-17 DIAGNOSIS — F330 Major depressive disorder, recurrent, mild: Secondary | ICD-10-CM | POA: Diagnosis not present

## 2022-03-17 DIAGNOSIS — Z114 Encounter for screening for human immunodeficiency virus [HIV]: Secondary | ICD-10-CM | POA: Diagnosis not present

## 2022-03-17 DIAGNOSIS — Z13220 Encounter for screening for lipoid disorders: Secondary | ICD-10-CM

## 2022-03-17 DIAGNOSIS — Z130 Encounter for screening for diseases of the blood and blood-forming organs and certain disorders involving the immune mechanism: Secondary | ICD-10-CM

## 2022-03-17 DIAGNOSIS — Z131 Encounter for screening for diabetes mellitus: Secondary | ICD-10-CM

## 2022-03-17 DIAGNOSIS — Z1159 Encounter for screening for other viral diseases: Secondary | ICD-10-CM | POA: Diagnosis not present

## 2022-03-17 NOTE — Assessment & Plan Note (Signed)
Resources for therapists given to patient.

## 2022-03-17 NOTE — Assessment & Plan Note (Signed)
Recommend increasing physical activity as tolerated.  Work up 250 minutes eating a well-balanced diet with portion control.

## 2022-03-18 LAB — CBC WITH DIFFERENTIAL/PLATELET
Absolute Monocytes: 525 cells/uL (ref 200–950)
Basophils Absolute: 61 cells/uL (ref 0–200)
Basophils Relative: 1.2 %
Eosinophils Absolute: 51 cells/uL (ref 15–500)
Eosinophils Relative: 1 %
HCT: 36.3 % (ref 35.0–45.0)
Hemoglobin: 11.7 g/dL (ref 11.7–15.5)
Lymphs Abs: 2035 cells/uL (ref 850–3900)
MCH: 24.8 pg — ABNORMAL LOW (ref 27.0–33.0)
MCHC: 32.2 g/dL (ref 32.0–36.0)
MCV: 76.9 fL — ABNORMAL LOW (ref 80.0–100.0)
MPV: 11.1 fL (ref 7.5–12.5)
Monocytes Relative: 10.3 %
Neutro Abs: 2428 cells/uL (ref 1500–7800)
Neutrophils Relative %: 47.6 %
Platelets: 281 10*3/uL (ref 140–400)
RBC: 4.72 10*6/uL (ref 3.80–5.10)
RDW: 15.1 % — ABNORMAL HIGH (ref 11.0–15.0)
Total Lymphocyte: 39.9 %
WBC: 5.1 10*3/uL (ref 3.8–10.8)

## 2022-03-18 LAB — COMPLETE METABOLIC PANEL WITH GFR
AG Ratio: 1.6 (calc) (ref 1.0–2.5)
ALT: 13 U/L (ref 6–29)
AST: 14 U/L (ref 10–30)
Albumin: 4.2 g/dL (ref 3.6–5.1)
Alkaline phosphatase (APISO): 47 U/L (ref 31–125)
BUN: 10 mg/dL (ref 7–25)
CO2: 29 mmol/L (ref 20–32)
Calcium: 9 mg/dL (ref 8.6–10.2)
Chloride: 107 mmol/L (ref 98–110)
Creat: 0.66 mg/dL (ref 0.50–0.96)
Globulin: 2.6 g/dL (calc) (ref 1.9–3.7)
Glucose, Bld: 83 mg/dL (ref 65–99)
Potassium: 4.1 mmol/L (ref 3.5–5.3)
Sodium: 141 mmol/L (ref 135–146)
Total Bilirubin: 0.3 mg/dL (ref 0.2–1.2)
Total Protein: 6.8 g/dL (ref 6.1–8.1)
eGFR: 125 mL/min/{1.73_m2} (ref >=60)

## 2022-03-18 LAB — HEPATITIS C ANTIBODY: Hepatitis C Ab: NONREACTIVE

## 2022-03-18 LAB — HIV ANTIBODY (ROUTINE TESTING W REFLEX): HIV 1&2 Ab, 4th Generation: NONREACTIVE

## 2022-03-18 LAB — LIPID PANEL
Cholesterol: 162 mg/dL (ref <200)
HDL: 63 mg/dL (ref >=50)
LDL Cholesterol (Calc): 87 mg/dL (calc)
Non-HDL Cholesterol (Calc): 99 mg/dL (calc) (ref <130)
Total CHOL/HDL Ratio: 2.6 (calc) (ref <5.0)
Triglycerides: 43 mg/dL (ref <150)

## 2022-03-18 LAB — HEMOGLOBIN A1C
Hgb A1c MFr Bld: 5.7 % of total Hgb — ABNORMAL HIGH (ref <5.7)
Mean Plasma Glucose: 117 mg/dL
eAG (mmol/L): 6.5 mmol/L

## 2022-03-18 LAB — TSH: TSH: 1.49 mIU/L

## 2022-11-08 NOTE — Progress Notes (Signed)
   There were no vitals taken for this visit.   Subjective:    Patient ID: Jacquel Redditt, female    DOB: [birth year], 27 y.o.   MRN: [id number]  HPI: Monesha Monreal is a 27 y.o. female  No chief complaint on file.   Relevant past medical, surgical, family and social history reviewed and updated as indicated. Interim medical history since our last visit reviewed. Allergies and medications reviewed and updated.  Review of Systems  Per HPI unless specifically indicated above     Objective:    There were no vitals taken for this visit.  Wt Readings from Last 3 Encounters:  03/17/22 190 lb (86.2 kg)  02/09/22 199 lb 15.3 oz (90.7 kg)  10/31/20 200 lb (90.7 kg)    Physical Exam  Results for orders placed or performed in visit on 03/17/22  HM PAP SMEAR  Result Value Ref Range   HM Pap smear normal       Assessment & Plan:   Problem List Items Addressed This Visit   None    Follow up plan: No follow-ups on file.

## 2022-11-09 ENCOUNTER — Other Ambulatory Visit: Payer: Self-pay

## 2022-11-09 ENCOUNTER — Ambulatory Visit: Payer: BC Managed Care – PPO | Admitting: Nurse Practitioner

## 2022-11-09 ENCOUNTER — Other Ambulatory Visit (HOSPITAL_COMMUNITY)
Admission: RE | Admit: 2022-11-09 | Discharge: 2022-11-09 | Disposition: A | Discharge status: Home / Self Care | Payer: BC Managed Care – PPO | Source: Ambulatory Visit | Attending: Nurse Practitioner | Admitting: Nurse Practitioner

## 2022-11-09 ENCOUNTER — Encounter: Payer: Self-pay | Admitting: Nurse Practitioner

## 2022-11-09 VITALS — BP 122/70 | HR 98 | Temp 98.2°F | Resp 18 | Ht 69.25 in | Wt 202.3 lb

## 2022-11-09 DIAGNOSIS — B9689 Other specified bacterial agents as the cause of diseases classified elsewhere: Secondary | ICD-10-CM | POA: Insufficient documentation

## 2022-11-09 DIAGNOSIS — Z113 Encounter for screening for infections with a predominantly sexual mode of transmission: Secondary | ICD-10-CM | POA: Diagnosis not present

## 2022-11-09 DIAGNOSIS — Z3009 Encounter for other general counseling and advice on contraception: Secondary | ICD-10-CM | POA: Diagnosis not present

## 2022-11-09 DIAGNOSIS — N760 Acute vaginitis: Secondary | ICD-10-CM | POA: Diagnosis not present

## 2022-11-09 DIAGNOSIS — Z202 Contact with and (suspected) exposure to infections with a predominantly sexual mode of transmission: Secondary | ICD-10-CM | POA: Diagnosis present

## 2022-11-09 LAB — POCT URINE PREGNANCY: Preg Test, Ur: NEGATIVE

## 2022-11-10 LAB — HEPATITIS C ANTIBODY: Hepatitis C Ab: NONREACTIVE

## 2022-11-10 LAB — CERVICOVAGINAL ANCILLARY ONLY
Bacterial Vaginitis (gardnerella): POSITIVE — AB
Candida Glabrata: NEGATIVE
Candida Vaginitis: NEGATIVE
Chlamydia: NEGATIVE
Comment: NEGATIVE
Comment: NEGATIVE
Comment: NEGATIVE
Comment: NEGATIVE
Comment: NEGATIVE
Comment: NORMAL
Neisseria Gonorrhea: NEGATIVE
Trichomonas: NEGATIVE

## 2022-11-10 LAB — RPR: RPR Ser Ql: NONREACTIVE

## 2022-11-10 LAB — HIV ANTIBODY (ROUTINE TESTING W REFLEX): HIV 1&2 Ab, 4th Generation: NONREACTIVE

## 2022-11-11 ENCOUNTER — Other Ambulatory Visit: Payer: Self-pay | Admitting: Nurse Practitioner

## 2022-11-11 ENCOUNTER — Telehealth: Payer: Self-pay | Admitting: Nurse Practitioner

## 2022-11-11 DIAGNOSIS — B9689 Other specified bacterial agents as the cause of diseases classified elsewhere: Secondary | ICD-10-CM

## 2022-11-11 MED ORDER — METRONIDAZOLE 500 MG PO TABS: 500.0000 mg | ORAL_TABLET | Freq: Two times a day (BID) | ORAL | 0 refills | Status: AC

## 2022-11-11 NOTE — Telephone Encounter (Signed)
Called left message for patient to check MYchart and check pharmacy. If any questions please call back

## 2022-11-11 NOTE — Telephone Encounter (Signed)
Pt returned missed call

## 2023-10-18 ENCOUNTER — Emergency Department
Admission: EM | Admit: 2023-10-18 | Discharge: 2023-10-18 | Disposition: A | Discharge status: Home / Self Care | Payer: Self-pay | Attending: Emergency Medicine | Admitting: Emergency Medicine

## 2023-10-18 ENCOUNTER — Encounter: Payer: Self-pay | Admitting: Emergency Medicine

## 2023-10-18 ENCOUNTER — Other Ambulatory Visit: Payer: Self-pay

## 2023-10-18 DIAGNOSIS — R1031 Right lower quadrant pain: Secondary | ICD-10-CM | POA: Insufficient documentation

## 2023-10-18 DIAGNOSIS — R197 Diarrhea, unspecified: Secondary | ICD-10-CM | POA: Insufficient documentation

## 2023-10-18 DIAGNOSIS — R1030 Lower abdominal pain, unspecified: Secondary | ICD-10-CM

## 2023-10-18 DIAGNOSIS — R1032 Left lower quadrant pain: Secondary | ICD-10-CM | POA: Diagnosis not present

## 2023-10-18 LAB — COMPREHENSIVE METABOLIC PANEL WITH GFR
ALT: 17 U/L (ref 0–44)
AST: 22 U/L (ref 15–41)
Albumin: 3.7 g/dL (ref 3.5–5.0)
Alkaline Phosphatase: 36 U/L — ABNORMAL LOW (ref 38–126)
Anion gap: 6 (ref 5–15)
BUN: 8 mg/dL (ref 6–20)
CO2: 24 mmol/L (ref 22–32)
Calcium: 8.6 mg/dL — ABNORMAL LOW (ref 8.9–10.3)
Chloride: 109 mmol/L (ref 98–111)
Creatinine, Ser: 0.59 mg/dL (ref 0.44–1.00)
GFR, Estimated: >60 mL/min (ref >60)
Glucose, Bld: 97 mg/dL (ref 70–99)
Potassium: 3.7 mmol/L (ref 3.5–5.1)
Sodium: 139 mmol/L (ref 135–145)
Total Bilirubin: 0.4 mg/dL (ref 0.0–1.2)
Total Protein: 6.5 g/dL (ref 6.5–8.1)

## 2023-10-18 LAB — URINALYSIS, ROUTINE W REFLEX MICROSCOPIC
Bilirubin Urine: NEGATIVE
Glucose, UA: NEGATIVE mg/dL
Ketones, ur: NEGATIVE mg/dL
Leukocytes,Ua: NEGATIVE
Nitrite: NEGATIVE
Protein, ur: NEGATIVE mg/dL
Specific Gravity, Urine: 1.025 (ref 1.005–1.030)
pH: 6 (ref 5.0–8.0)

## 2023-10-18 LAB — CBC WITH DIFFERENTIAL/PLATELET
Abs Immature Granulocytes: 0.01 10*3/uL (ref 0.00–0.07)
Basophils Absolute: 0.1 10*3/uL (ref 0.0–0.1)
Basophils Relative: 1 %
Eosinophils Absolute: 0.1 10*3/uL (ref 0.0–0.5)
Eosinophils Relative: 2 %
HCT: 33.5 % — ABNORMAL LOW (ref 36.0–46.0)
Hemoglobin: 11.4 g/dL — ABNORMAL LOW (ref 12.0–15.0)
Immature Granulocytes: 0 %
Lymphocytes Relative: 38 %
Lymphs Abs: 2.3 10*3/uL (ref 0.7–4.0)
MCH: 24.3 pg — ABNORMAL LOW (ref 26.0–34.0)
MCHC: 34 g/dL (ref 30.0–36.0)
MCV: 71.3 fL — ABNORMAL LOW (ref 80.0–100.0)
Monocytes Absolute: 0.6 10*3/uL (ref 0.1–1.0)
Monocytes Relative: 10 %
Neutro Abs: 2.9 10*3/uL (ref 1.7–7.7)
Neutrophils Relative %: 49 %
Platelets: 228 10*3/uL (ref 150–400)
RBC: 4.7 MIL/uL (ref 3.87–5.11)
RDW: 17.2 % — ABNORMAL HIGH (ref 11.5–15.5)
WBC: 6 10*3/uL (ref 4.0–10.5)
nRBC: 0 % (ref 0.0–0.2)

## 2023-10-18 LAB — POC URINE PREG, ED: Preg Test, Ur: NEGATIVE

## 2023-10-18 NOTE — ED Triage Notes (Signed)
 Pt to ED for bilateral lower abdominal pain since yesterday. States diarrhea this morning. Denies dysuria. LMP was 6/1.

## 2023-10-18 NOTE — ED Notes (Signed)
 See triage note  Presents with mid to lower abd pain since yesterday  States pain eased off thur out the day  Denies any n/v.,urinary sxs' or fever. Diarrhea started this am

## 2023-10-18 NOTE — ED Provider Notes (Signed)
 [facility] Provider Note   Event Date/Time   First MD Initiated Contact with Patient 10/18/23 0920     (approximate) History  Abdominal Pain  HPI Monica Strickland is a 28 y.o. female with a past medical history of obesity and depression who presents complaining of bilateral lower abdominal pain that began yesterday and is associated with diarrhea that began this morning.  Patient denies any recent travel, sick contacts, or food out of the ordinary.  Patient states that she has had pain similar to this during and before her period however her LMP was 6/1 and states that this pain is different.  Describes it as a cramping/sharp bilateral abdominal pain that has no exacerbating or relieving factors. ROS: Patient currently denies any vision changes, tinnitus, difficulty speaking, facial droop, sore throat, chest pain, shortness of breath, nausea/vomiting, dysuria, or weakness/numbness/paresthesias in any extremity   Physical Exam  Triage Vital Signs: ED Triage Vitals  Encounter Vitals Group     BP 10/18/23 0834 (!) 137/94     Girls Systolic BP Percentile --      Girls Diastolic BP Percentile --      Boys Systolic BP Percentile --      Boys Diastolic BP Percentile --      Pulse Rate 10/18/23 0834 85     Resp 10/18/23 0834 20     Temp 10/18/23 0834 98.4 F (36.9 C)     Temp Source 10/18/23 0834 Oral     SpO2 10/18/23 0834 100 %     Weight 10/18/23 0834 214 lb (97.1 kg)     Height 10/18/23 0834 5' 10 (1.778 m)     Head Circumference --      Peak Flow --      Pain Score 10/18/23 0832 7     Pain Loc --      Pain Education --      Exclude from Growth Chart --    Most recent vital signs: Vitals:   10/18/23 0834  BP: (!) 137/94  Pulse: 85  Resp: 20  Temp: 98.4 F (36.9 C)  SpO2: 100%   General: Awake, oriented x4. CV:  Good peripheral perfusion. Resp:  Normal effort. Abd:  No distention. Other:  Middle-aged obese African-American female resting  comfortably in no acute distress ED Results / Procedures / Treatments  Labs (all labs ordered are listed, but only abnormal results are displayed) Labs Reviewed  URINALYSIS, ROUTINE W REFLEX MICROSCOPIC - Abnormal; Notable for the following components:      Result Value   Color, Urine YELLOW (*)    APPearance CLEAR (*)    Hgb urine dipstick SMALL (*)    Bacteria, UA RARE (*)    All other components within normal limits  CBC WITH DIFFERENTIAL/PLATELET - Abnormal; Notable for the following components:   Hemoglobin 11.4 (*)    HCT 33.5 (*)    MCV 71.3 (*)    MCH 24.3 (*)    RDW 17.2 (*)    All other components within normal limits  COMPREHENSIVE METABOLIC PANEL WITH GFR - Abnormal; Notable for the following components:   Calcium 8.6 (*)    Alkaline Phosphatase 36 (*)    All other components within normal limits  POC URINE PREG, ED   PROCEDURES: Critical Care performed: No Procedures MEDICATIONS ORDERED IN ED: Medications - No data to display IMPRESSION / MDM / ASSESSMENT AND PLAN / ED COURSE  I reviewed the triage vital signs and the nursing  notes.                             The patient is on the cardiac monitor to evaluate for evidence of arrhythmia and/or significant heart rate changes. Patient's presentation is most consistent with acute presentation with potential threat to life or bodily function. This patient presents with diarrhea consistent with likely viral enteritis. Doubt acute bacterial diarrhea. Considered, but think unlikely, partial SBO, appendicitis, diverticulitis, other intraabdominal infection. Low suspicion for secondary causes of diarrhea such as hyperadrenergic state, pheo, adrenal crisis, hyperthyroidism, or sepsis. Doubt antibiotic associated diarrhea.  Plan: PO rehydration, reassess, discharge with OTC antidiarrheal meds  Dispo: Discharge home with PCP follow-up and strict return precautions   FINAL CLINICAL IMPRESSION(S) / ED DIAGNOSES   Final  diagnoses:  Lower abdominal pain  Diarrhea of presumed infectious origin   Rx / DC Orders   ED Discharge Orders     None      Note:  This document was prepared using Dragon voice recognition software and may include unintentional dictation errors.   Trinisha Paget K, MD 10/18/23 1150

## 2023-10-18 NOTE — Discharge Instructions (Addendum)
 Please use loperamide (Imodium) 2 mg per dose after every episode of diarrhea up to 8 mg/day.  Please only use this regimen if you are becoming dehydrated
